# Patient Record
Sex: Male | Born: 1950
Health system: Southern US, Community
[De-identification: ages and names within clinical notes are randomized; demographics above are authoritative.]

## PROBLEM LIST (undated history)

## (undated) DIAGNOSIS — Z79899 Other long term (current) drug therapy: Secondary | ICD-10-CM

## (undated) DIAGNOSIS — R0602 Shortness of breath: Secondary | ICD-10-CM

## (undated) DIAGNOSIS — R002 Palpitations: Secondary | ICD-10-CM

## (undated) DIAGNOSIS — R7303 Prediabetes: Secondary | ICD-10-CM

## (undated) DIAGNOSIS — E119 Type 2 diabetes mellitus without complications: Secondary | ICD-10-CM

## (undated) DIAGNOSIS — R5381 Other malaise: Secondary | ICD-10-CM

## (undated) DIAGNOSIS — I10 Essential (primary) hypertension: Secondary | ICD-10-CM

## (undated) DIAGNOSIS — R911 Solitary pulmonary nodule: Secondary | ICD-10-CM

## (undated) DIAGNOSIS — F411 Generalized anxiety disorder: Secondary | ICD-10-CM

## (undated) DIAGNOSIS — R55 Syncope and collapse: Secondary | ICD-10-CM

## (undated) DIAGNOSIS — M109 Gout, unspecified: Secondary | ICD-10-CM

## (undated) DIAGNOSIS — E7800 Pure hypercholesterolemia, unspecified: Secondary | ICD-10-CM

## (undated) DIAGNOSIS — E785 Hyperlipidemia, unspecified: Secondary | ICD-10-CM

## (undated) DIAGNOSIS — E878 Other disorders of electrolyte and fluid balance, not elsewhere classified: Secondary | ICD-10-CM

## (undated) DIAGNOSIS — R072 Precordial pain: Secondary | ICD-10-CM

## (undated) DIAGNOSIS — R5383 Other fatigue: Secondary | ICD-10-CM

## (undated) DIAGNOSIS — C61 Malignant neoplasm of prostate: Secondary | ICD-10-CM

## (undated) HISTORY — DX: Prediabetes: R73.03

## (undated) HISTORY — DX: Essential (primary) hypertension: I10

## (undated) HISTORY — PX: PROSTATE BIOPSY: SHX241

## (undated) HISTORY — DX: Precordial pain: R07.2

## (undated) HISTORY — DX: Hyperlipidemia, unspecified: E78.5

## (undated) HISTORY — DX: Syncope and collapse: R55

## (undated) HISTORY — DX: Solitary pulmonary nodule: R91.1

## (undated) HISTORY — DX: Type 2 diabetes mellitus without complications: E11.9

## (undated) HISTORY — DX: Shortness of breath: R06.02

## (undated) HISTORY — DX: Gout, unspecified: M10.9

## (undated) HISTORY — DX: Pure hypercholesterolemia, unspecified: E78.00

## (undated) HISTORY — DX: Other long term (current) drug therapy: Z79.899

## (undated) HISTORY — PX: WISDOM TOOTH EXTRACTION: SHX21

## (undated) HISTORY — DX: Generalized anxiety disorder: F41.1

## (undated) HISTORY — DX: Other malaise: R53.81

## (undated) HISTORY — DX: Palpitations: R00.2

## (undated) HISTORY — DX: Other fatigue: R53.83

## (undated) HISTORY — DX: Other disorders of electrolyte and fluid balance, not elsewhere classified: E87.8

---

## 2012-02-11 ENCOUNTER — Encounter (INDEPENDENT_AMBULATORY_CARE_PROVIDER_SITE_OTHER): Payer: Self-pay | Admitting: *Deleted

## 2012-02-11 DIAGNOSIS — I498 Other specified cardiac arrhythmias: Secondary | ICD-10-CM

## 2012-02-11 DIAGNOSIS — R002 Palpitations: Secondary | ICD-10-CM

## 2012-02-11 DIAGNOSIS — R55 Syncope and collapse: Secondary | ICD-10-CM

## 2012-02-11 NOTE — Progress Notes (Unsigned)
48 hour holter requested by Dr. Olena Leatherwood at d/c from [facility] d/t near-syncope, bradycardia, and palpitations. Pt will f/u with Dr. Kirke Corin on 2/26.

## 2012-02-17 ENCOUNTER — Encounter: Payer: Self-pay | Admitting: *Deleted

## 2012-02-25 ENCOUNTER — Encounter: Payer: Self-pay | Admitting: Cardiovascular Disease

## 2012-02-25 ENCOUNTER — Ambulatory Visit (INDEPENDENT_AMBULATORY_CARE_PROVIDER_SITE_OTHER): Payer: BC Managed Care – PPO | Admitting: Cardiovascular Disease

## 2012-02-25 VITALS — BP 124/85 | HR 73 | Ht 72.0 in | Wt 209.0 lb

## 2012-02-25 DIAGNOSIS — R002 Palpitations: Secondary | ICD-10-CM | POA: Insufficient documentation

## 2012-02-25 NOTE — Patient Instructions (Signed)
Your physician recommends that you schedule a follow-up appointment as needed.   Your physician recommends that you continue on your current medications as directed. Please refer to the Current Medication list given to you today.  

## 2012-02-25 NOTE — Progress Notes (Signed)
HPI  This is a 61 year old [name] who is here today for evaluation of palpitations. He reports prolonged history of mild palpitations described as skipping in his heart without any other associated symptoms. Recently in January, he had an episode that lasted longer than usual and was associated with mild dizziness. He called EMS and was found to be stable. He subsequently followed up with Dr. Bartholomew Crews and was admitted for observation. On his baseline ECG he was noted to have slight bradycardia with a heart rate of 54 beats per minute. Telemetry showed no significant arrhythmic events. His labs were unremarkable. He was discharged home next day. He has not had any prolonged episodes of palpitations since then. He denies tachycardia. There has been no recent syncope. He is physically very active and able to perform all activities of daily living including physical yard work. He denies any exertional chest pain or dyspnea. There is no family history of premature coronary artery disease or sudden death. He is not a smoker.  No Known Allergies   No current outpatient prescriptions on file prior to visit.     Past Medical History  Diagnosis Date  . Syncope and collapse   . Unspecified essential hypertension   . Other and unspecified hyperlipidemia   . Solitary pulmonary nodule   . Encounter for long-term (current) use of other medications   . Precordial pain   . Anxiety state, unspecified   . Palpitations   . Pure hypercholesterolemia   . Other malaise and fatigue   . Shortness of breath      Past Surgical History  Procedure Date  . Wisdom tooth extraction      History reviewed. No pertinent family history.   History   Social History  . Marital Status: Married    Spouse Name: Jeffrey [name]    Number of Children: N/A  . Years of Education: N/A   Occupational History  . [employer]    Social History Main Topics  . Smoking status: Never Smoker   . Smokeless tobacco: Never  Used  . Alcohol Use: Yes     very rare  . Drug Use: No  . Sexually Active: Not on file   Other Topics Concern  . Not on file   Social History Narrative  . No narrative on file     ROS Constitutional: Negative for fever, chills, diaphoresis, activity change, appetite change and fatigue.  HENT: Negative for hearing loss, nosebleeds, congestion, sore throat, facial swelling, drooling, trouble swallowing, neck pain, voice change, sinus pressure and tinnitus.  Eyes: Negative for photophobia, pain, discharge and visual disturbance.  Respiratory: Negative for apnea, cough, chest tightness, shortness of breath and wheezing.  Cardiovascular: Negative for chest pain and leg swelling.  Gastrointestinal: Negative for nausea, vomiting, abdominal pain, diarrhea, constipation, blood in stool and abdominal distention.  Genitourinary: Negative for dysuria, urgency, frequency, hematuria and decreased urine volume.  Musculoskeletal: Negative for myalgias, back pain, joint swelling, arthralgias and gait problem.  Skin: Negative for color change, pallor, rash and wound.  Neurological: Negative for dizziness, tremors, seizures, syncope, speech difficulty, weakness, light-headedness, numbness and headaches.  Psychiatric/Behavioral: Negative for suicidal ideas, hallucinations, behavioral problems and agitation. The patient is not nervous/anxious.     PHYSICAL EXAM   BP 124/85  Pulse 73  Ht 6' (1.829 m)  Wt 209 lb (94.802 kg)  BMI 28.35 kg/m2  SpO2 97%  Constitutional: He is oriented to person, place, and time. He appears well-developed and well-nourished. No distress.  HENT: No nasal discharge.  Head: Normocephalic and atraumatic.  Eyes: Pupils are equal and round. Right eye exhibits no discharge. Left eye exhibits no discharge.  Neck: Normal range of motion. Neck supple. No JVD present. No thyromegaly present.  Cardiovascular: Normal rate, regular rhythm, normal heart sounds and. Exam reveals no  gallop and no friction rub. No murmur heard.  Pulmonary/Chest: Effort normal and breath sounds normal. No stridor. No respiratory distress. He has no wheezes. He has no rales. He exhibits no tenderness.  Abdominal: Soft. Bowel sounds are normal. He exhibits no distension. There is no tenderness. There is no rebound and no guarding.  Musculoskeletal: Normal range of motion. He exhibits no edema and no tenderness.  Neurological: He is alert and oriented to person, place, and time. Coordination normal.  Skin: Skin is warm and dry. No rash noted. He is not diaphoretic. No erythema. No pallor.  Psychiatric: He has a normal mood and affect. His behavior is normal. Judgment and thought content normal.       EKG: His recent ECG while hospitalized was reviewed. It showed normal sinus rhythm with no significant ST or T wave changes. Normal PR and QT intervals.   ASSESSMENT AND PLAN

## 2012-02-25 NOTE — Assessment & Plan Note (Signed)
The patient has isolated palpitations described as skipping in suggestive of premature beats. He had a 48-hour Holter monitor which showed normal sinus rhythm with isolated PACs and PVCs. There was no other significant arrhythmia these premature beats were asked him not frequent as he had a total of only 14 PACs in 48 hours. He has no symptoms suggestive of angina or heart failure. His physical exam is not suggestive of any cardiac murmurs. The patient was reassured. I asked him to cut down on caffeine intake. We discussed the pathophysiology of premature beats and overall benign nature. He is to followup as needed if symptoms worsen.

## 2015-05-01 ENCOUNTER — Encounter: Payer: Self-pay | Admitting: *Deleted

## 2015-05-01 ENCOUNTER — Ambulatory Visit (INDEPENDENT_AMBULATORY_CARE_PROVIDER_SITE_OTHER): Payer: 59 | Admitting: Cardiology

## 2015-05-01 ENCOUNTER — Encounter: Payer: Self-pay | Admitting: Cardiology

## 2015-05-01 VITALS — BP 148/98 | HR 59 | Ht 72.0 in | Wt 201.0 lb

## 2015-05-01 DIAGNOSIS — R002 Palpitations: Secondary | ICD-10-CM | POA: Diagnosis not present

## 2015-05-01 MED ORDER — METOPROLOL TARTRATE 25 MG PO TABS: 12.5000 mg | ORAL_TABLET | Freq: Two times a day (BID) | ORAL | Status: DC

## 2015-05-01 NOTE — Progress Notes (Signed)
Patient ID: Jeffrey [name], male   DOB: 1951-03-11, 64 y.o.   MRN: [id number]     Clinical Summary Jeffrey [name] is a 64 y.o.male seen today as a new patient for the following medical problems.   1. Palpitations - last seen by Dr Fletcher Anon 01/2012 for palpitations.  - 48 hr holter at that time showed NSR , isolated PACs and PVCs, and no arrhythmias.  - admitted to [facility] 03/2015, records reviewed. While sleeping, awoke suddenly with feeling of palpitations, diaphoretic, no chest pain, mild SOB. Lasted approximately 6-8 minutes No heavy caffeine or EtOH that night. - ER records show K 3.6, Cr 0.99, trop neg, Hgb 8, EKG NSR, and CXR no acute process - in general no coffee, occasional sodas caffeine free, occasional tea, no energy drinks, no EtOH - has had milder episodes since that time, occur daily. No SOB or DOE. Can occur at rest or with exertion.   Past Medical History  Diagnosis Date  . Syncope and collapse   . Unspecified essential hypertension   . Other and unspecified hyperlipidemia   . Solitary pulmonary nodule   . Encounter for long-term (current) use of other medications   . Precordial pain   . Anxiety state, unspecified   . Palpitations   . Pure hypercholesterolemia   . Other malaise and fatigue   . Shortness of breath      No Known Allergies   Current Outpatient Prescriptions  Medication Sig Dispense Refill  . atorvastatin (LIPITOR) 20 MG tablet Take 20 mg by mouth daily.    . Cholecalciferol (VITAMIN D3) 2000 UNITS TABS Take by mouth daily.    . fish oil-omega-3 fatty acids 1000 MG capsule Take 1 g by mouth daily.    . Garlic 937 MG CAPS Take by mouth daily.    Marland Kitchen lisinopril (PRINIVIL,ZESTRIL) 10 MG tablet Take 10 mg by mouth daily.     No current facility-administered medications for this visit.     Past Surgical History  Procedure Laterality Date  . Wisdom tooth extraction       No Known Allergies    No family history on file.   Social History Mr.  [name] reports that he has never smoked. He has never used smokeless tobacco. Jeffrey [name] reports that he drinks alcohol.   Review of Systems CONSTITUTIONAL: No weight loss, fever, chills, weakness or fatigue.  HEENT: Eyes: No visual loss, blurred vision, double vision or yellow sclerae.No hearing loss, sneezing, congestion, runny nose or sore throat.  SKIN: No rash or itching.  CARDIOVASCULAR: per HPI RESPIRATORY: No shortness of breath, cough or sputum.  GASTROINTESTINAL: No anorexia, nausea, vomiting or diarrhea. No abdominal pain or blood.  GENITOURINARY: No burning on urination, no polyuria NEUROLOGICAL: No headache, dizziness, syncope, paralysis, ataxia, numbness or tingling in the extremities. No change in bowel or bladder control.  MUSCULOSKELETAL: No muscle, back pain, joint pain or stiffness.  LYMPHATICS: No enlarged nodes. No history of splenectomy.  PSYCHIATRIC: No history of depression or anxiety.  ENDOCRINOLOGIC: No reports of sweating, cold or heat intolerance. No polyuria or polydipsia.  Marland Kitchen   Physical Examination p 59 bp 148/98 Wt 201 lbs BMI 27 Gen: resting comfortably, no acute distress HEENT: no scleral icterus, pupils equal round and reactive, no palptable cervical adenopathy,  CV: RRR, no m/r/g, no JVD, no carotid bruits Resp: Clear to auscultation bilaterally GI: abdomen is soft, non-tender, non-distended, normal bowel sounds, no hepatosplenomegaly MSK: extremities are warm, no edema.  Skin: warm, no rash  Neuro:  no focal deficits Psych: appropriate affect     Assessment and Plan  1. Palpitations - several year history with fairly normal Holter monitor in 2013 - recent episode requiring ER visit, EKG at that time showed NSR.  - will start lopressor 12.5mg  bid - will request most recent labs from Dr Amanda Pea to see if TSH and Mg included, if not will need to order next visit  2. Anemia - Hgb of 8 during ER visit, anemia workup per pcp  F/u 6  weeks      Arnoldo Lenis, M.D

## 2015-05-01 NOTE — Patient Instructions (Signed)
Your physician recommends that you schedule a follow-up appointment in: [place] DR. [employer]  Your physician has recommended you make the following change in your medication:   START METOPROLOL 12.5 MG TWICE DAILY  CONTINUE ALL OTHER MEDICATIONS AS DIRECTED  WE WILL REQUEST LABS FROM YOUR PCP AND RECORDS FROM [facility]   Thank you for choosing [facility]!!

## 2015-05-09 ENCOUNTER — Emergency Department (HOSPITAL_COMMUNITY): Payer: 59

## 2015-05-09 ENCOUNTER — Encounter (HOSPITAL_COMMUNITY): Payer: Self-pay

## 2015-05-09 ENCOUNTER — Emergency Department (HOSPITAL_COMMUNITY)
Admission: EM | Admit: 2015-05-09 | Discharge: 2015-05-09 | Disposition: A | Discharge status: Home / Self Care | Payer: 59 | Attending: Emergency Medicine | Admitting: Emergency Medicine

## 2015-05-09 DIAGNOSIS — R002 Palpitations: Secondary | ICD-10-CM | POA: Diagnosis present

## 2015-05-09 DIAGNOSIS — Z79899 Other long term (current) drug therapy: Secondary | ICD-10-CM | POA: Diagnosis not present

## 2015-05-09 DIAGNOSIS — E785 Hyperlipidemia, unspecified: Secondary | ICD-10-CM | POA: Diagnosis not present

## 2015-05-09 DIAGNOSIS — R52 Pain, unspecified: Secondary | ICD-10-CM

## 2015-05-09 DIAGNOSIS — Z8659 Personal history of other mental and behavioral disorders: Secondary | ICD-10-CM | POA: Insufficient documentation

## 2015-05-09 DIAGNOSIS — E780 Pure hypercholesterolemia: Secondary | ICD-10-CM | POA: Insufficient documentation

## 2015-05-09 DIAGNOSIS — I10 Essential (primary) hypertension: Secondary | ICD-10-CM | POA: Diagnosis not present

## 2015-05-09 LAB — I-STAT TROPONIN, ED: Troponin i, poc: 0 ng/mL (ref 0.00–0.08)

## 2015-05-09 LAB — I-STAT CHEM 8, ED
BUN: 8 mg/dL (ref 6–20)
CALCIUM ION: 1.2 mmol/L (ref 1.13–1.30)
CHLORIDE: 105 mmol/L (ref 101–111)
Creatinine, Ser: 0.9 mg/dL (ref 0.61–1.24)
GLUCOSE: 94 mg/dL (ref 70–99)
HEMATOCRIT: 43 % (ref 39.0–52.0)
HEMOGLOBIN: 14.6 g/dL (ref 13.0–17.0)
POTASSIUM: 4.4 mmol/L (ref 3.5–5.1)
Sodium: 140 mmol/L (ref 135–145)
TCO2: 22 mmol/L (ref 0–100)

## 2015-05-09 NOTE — ED Notes (Signed)
PT placed in gown and in bed. Pt monitored by pulse ox, bp cuff, and 12-lead. 

## 2015-05-09 NOTE — ED Notes (Signed)
Lab results of I-Stat Tron.was 0.00 reported to Dr.Zammit and Chem 8.

## 2015-05-09 NOTE — ED Notes (Signed)
Pt.   Was diagnosed with Palpitations on May 5th, 2016.a  Pt. Was placed on Metoprolol last Tuesday.  Pt was at work today and felt the palpations.  Pt. Denies any chest pain or sob .  Pt. Denies any n/v/d.  Pt. Reports having intermittent palpitations last week.   He felt today was worse.  Skin is warm and dry.  NSR on the monitor with an occasional PAC.  GCS.  Pt.s sees Dr,. Branch.

## 2015-05-09 NOTE — Discharge Instructions (Signed)
Take one extra dose of your lopressor daily  If you continue to have lots of palpitations.  Follow up with your md next week.

## 2015-05-10 ENCOUNTER — Telehealth (HOSPITAL_BASED_OUTPATIENT_CLINIC_OR_DEPARTMENT_OTHER): Payer: Self-pay | Admitting: Emergency Medicine

## 2015-05-10 NOTE — ED Provider Notes (Signed)
CSN: [id number]     Arrival date & time 05/09/15  1306 History   First MD Initiated Contact with Patient 05/09/15 1309     Chief Complaint  Patient presents with  . Irregular Heart Beat     (Consider location/radiation/quality/duration/timing/severity/associated sxs/prior Treatment) Patient is a 64 y.o. male presenting with palpitations. The history is provided by the patient (pt complains of palpitations).  Palpitations Palpitations quality:  Regular Onset quality:  At rest Timing:  Intermittent Progression:  Waxing and waning Chronicity:  Recurrent Context: not anxiety   Associated symptoms: no back pain, no chest pain and no cough     Past Medical History  Diagnosis Date  . Syncope and collapse   . Unspecified essential hypertension   . Other and unspecified hyperlipidemia   . Solitary pulmonary nodule   . Encounter for long-term (current) use of other medications   . Precordial pain   . Anxiety state, unspecified   . Palpitations   . Pure hypercholesterolemia   . Other malaise and fatigue   . Shortness of breath    Past Surgical History  Procedure Laterality Date  . Wisdom tooth extraction     Family History  Problem Relation Age of Onset  . Heart disease Mother    History  Substance Use Topics  . Smoking status: Never Smoker   . Smokeless tobacco: Never Used  . Alcohol Use: Yes     Comment: very rare    Review of Systems  Constitutional: Negative for appetite change and fatigue.  HENT: Negative for congestion, ear discharge and sinus pressure.   Eyes: Negative for discharge.  Respiratory: Negative for cough.   Cardiovascular: Positive for palpitations. Negative for chest pain.  Gastrointestinal: Negative for abdominal pain and diarrhea.  Genitourinary: Negative for frequency and hematuria.  Musculoskeletal: Negative for back pain.  Skin: Negative for rash.  Neurological: Negative for seizures and headaches.  Psychiatric/Behavioral: Negative for  hallucinations.      Allergies  Review of patient's allergies indicates no known allergies.  Home Medications   Prior to Admission medications   Medication Sig Start Date End Date Taking? Authorizing Provider  atorvastatin (LIPITOR) 20 MG tablet Take 20 mg by mouth daily.   Yes Historical Provider, MD  Cholecalciferol (VITAMIN D3) 2000 UNITS TABS Take 2,000 Units by mouth daily.    Yes Historical Provider, MD  ferrous sulfate 325 (65 FE) MG tablet Take 325 mg by mouth 2 (two) times daily. 04/14/15  Yes Historical Provider, MD  fish oil-omega-3 fatty acids 1000 MG capsule Take 1 g by mouth daily.   Yes Historical Provider, MD  lisinopril (PRINIVIL,ZESTRIL) 10 MG tablet Take 10 mg by mouth daily.   Yes Historical Provider, MD  metoprolol tartrate (LOPRESSOR) 25 MG tablet Take 0.5 tablets (12.5 mg total) by mouth 2 (two) times daily. 05/01/15  Yes Arnoldo Lenis, MD   BP 146/79 mmHg  Pulse 55  Temp(Src) 98 F (36.7 C) (Oral)  Resp 20  SpO2 100% Physical Exam  Constitutional: He is oriented to person, place, and time. He appears well-developed.  HENT:  Head: Normocephalic.  Eyes: Conjunctivae and EOM are normal. No scleral icterus.  Neck: Neck supple. No thyromegaly present.  Cardiovascular: Normal rate and regular rhythm.  Exam reveals no gallop and no friction rub.   No murmur heard. Pulmonary/Chest: No stridor. He has no wheezes. He has no rales. He exhibits no tenderness.  Abdominal: He exhibits no distension. There is no tenderness. There is no rebound.  Musculoskeletal: Normal range of motion. He exhibits no edema.  Lymphadenopathy:    He has no cervical adenopathy.  Neurological: He is oriented to person, place, and time. He exhibits normal muscle tone. Coordination normal.  Skin: No rash noted. No erythema.  Psychiatric: He has a normal mood and affect. His behavior is normal.    ED Course  Procedures (including critical care time) Labs Review Labs Reviewed  I-STAT  CHEM 8, ED  I-STAT TROPOININ, ED    Imaging Review Dg Chest Port 1 View  05/09/2015   CLINICAL DATA:  Woke up by 1230 hours with heart racing and diaphoresis, history palpitations, hypertension  EXAM: PORTABLE CHEST - 1 VIEW  COMPARISON:  Portable exam 1337 hours compared to 04/14/2015  FINDINGS: Enlargement of cardiac silhouette.  Mediastinal contours and pulmonary vascularity normal.  Minimal bibasilar atelectasis.  Lungs otherwise clear.  No pleural effusion or pneumothorax.  Minimal endplate spur formation thoracic spine.  IMPRESSION: Enlargement of cardiac silhouette with minimal bibasilar atelectasis.   Electronically Signed   By: Lavonia Dana M.D.   On: 05/09/2015 13:54     EKG Interpretation   Date/Time:  Tuesday May 09 2015 13:13:31 EDT Ventricular Rate:  67 PR Interval:  154 QRS Duration: 86 QT Interval:  385 QTC Calculation: 406 R Axis:   31 Text Interpretation:  Sinus rhythm Atrial premature complex Confirmed by  Darolyn Double  MD, Chancie Lampert ([id number]) on 05/09/2015 1:59:51 PM      MDM   Final diagnoses:  Pain  Palpitations    Mild palpitations.  Pt told to take and extra dose of lopressor if needed and follow up with his md    Milton Ferguson, MD 05/10/15 [phone]

## 2015-05-18 ENCOUNTER — Telehealth: Payer: Self-pay | Admitting: Cardiology

## 2015-05-18 MED ORDER — DILTIAZEM HCL 30 MG PO TABS: 30.0000 mg | ORAL_TABLET | Freq: Two times a day (BID) | ORAL | Status: DC

## 2015-05-18 NOTE — Telephone Encounter (Signed)
Pt made aware will d/c metoprolol and start diltiazem 30 mg BID. Updated medication list and sent medication to pharmacy.

## 2015-05-18 NOTE — Telephone Encounter (Signed)
Pt says since starting Lopressor he has some fatigue, "like having a hangover", doesn't happen everyday, wants to know if anything he can do to relieve symptoms. Denies CP/dizziness. Told pt to take with food and he stated that when he took this morning with breakfast that he has not felt as bad today. Will forward to Dr. Harl Bowie for further suggestions.

## 2015-05-18 NOTE — Telephone Encounter (Signed)
Lopressor can sometimes have that affect. I would suggest trying him on another medication to help with his palpitations that is less likely to cause fatigue. Recommend stopping lopressor for now, starting short acting diltiazem 30mg  bid   Zandra Abts MD

## 2015-05-18 NOTE — Telephone Encounter (Signed)
Jeffrey [name] calls stating that since taking the (LOPRESSOR) 25 MG tablet he feels like the medicine is causing him to have a "hangover" feeling, fatigue, little nervous feeling  Please advise

## 2015-06-01 ENCOUNTER — Encounter: Payer: Self-pay | Admitting: Cardiology

## 2015-06-01 ENCOUNTER — Ambulatory Visit (INDEPENDENT_AMBULATORY_CARE_PROVIDER_SITE_OTHER): Payer: 59 | Admitting: Cardiology

## 2015-06-01 VITALS — BP 137/83 | HR 65 | Ht 72.0 in | Wt 196.0 lb

## 2015-06-01 DIAGNOSIS — R002 Palpitations: Secondary | ICD-10-CM

## 2015-06-01 NOTE — Patient Instructions (Signed)
Your physician wants you to follow-up in: 6 months with Dr. Bryna Colander will receive a reminder letter in the mail two months in advance. If you don't receive a letter, please call our office to schedule the follow-up appointment.  Your physician recommends that you continue on your current medications as directed. Please refer to the Current Medication list given to you today.  Your physician recommends that you return for lab work TSH/MG  Thank you for choosing [facility]!!

## 2015-06-01 NOTE — Progress Notes (Signed)
Clinical Summary Mr. Furio is a 64 y.o.male seen today for follow up of the following medical problems.   1. Palpitations - last seen by Dr Fletcher Anon 01/2012 for palpitations.  - 48 hr holter at that time showed NSR , isolated PACs and PVCs, and no arrhythmias.  - admitted to [facility] 03/2015, records reviewed. While sleeping, awoke suddenly with feeling of palpitations, diaphoretic, no chest pain, mild SOB. Lasted approximately 6-8 minutes No heavy caffeine or EtOH that night. - ER records show K 3.6, Cr 0.99, trop neg, Hgb 8, EKG NSR, and CXR no acute process - in general no coffee, occasional sodas caffeine free, occasional tea, no energy drinks, no EtOH - has had milder episodes since that time, occur daily. No SOB or DOE. Can occur at rest or with exertion.  - 03/2015 labs: Hgb 10, K 3.6,   - Since last visit seen in ER 05/10/15 with palpitations. EKG showed SR with isolated PAC.  - we started him on lopressor but this caused fatigue, changed to dilt and fatigue resolved, palpitations controlled.    Past Medical History  Diagnosis Date  . Syncope and collapse   . Unspecified essential hypertension   . Other and unspecified hyperlipidemia   . Solitary pulmonary nodule   . Encounter for long-term (current) use of other medications   . Precordial pain   . Anxiety state, unspecified   . Palpitations   . Pure hypercholesterolemia   . Other malaise and fatigue   . Shortness of breath      No Known Allergies   Current Outpatient Prescriptions  Medication Sig Dispense Refill  . atorvastatin (LIPITOR) 20 MG tablet Take 20 mg by mouth daily.    . Cholecalciferol (VITAMIN D3) 2000 UNITS TABS Take 2,000 Units by mouth daily.     Marland Kitchen diltiazem (CARDIZEM) 30 MG tablet Take 1 tablet (30 mg total) by mouth 2 (two) times daily. 60 tablet 3  . ferrous sulfate 325 (65 FE) MG tablet Take 325 mg by mouth 2 (two) times daily.  0  . fish oil-omega-3 fatty acids 1000 MG capsule Take 1 g by  mouth daily.    Marland Kitchen lisinopril (PRINIVIL,ZESTRIL) 10 MG tablet Take 10 mg by mouth daily.     No current facility-administered medications for this visit.     Past Surgical History  Procedure Laterality Date  . Wisdom tooth extraction       No Known Allergies    Family History  Problem Relation Age of Onset  . Heart disease Mother      Social History Mr. Levario reports that he has never smoked. He has never used smokeless tobacco. Mr. Angelillo reports that he drinks alcohol.   Review of Systems CONSTITUTIONAL: No weight loss, fever, chills, weakness or fatigue.  HEENT: Eyes: No visual loss, blurred vision, double vision or yellow sclerae.No hearing loss, sneezing, congestion, runny nose or sore throat.  SKIN: No rash or itching.  CARDIOVASCULAR: per HPI RESPIRATORY: No shortness of breath, cough or sputum.  GASTROINTESTINAL: No anorexia, nausea, vomiting or diarrhea. No abdominal pain or blood.  GENITOURINARY: No burning on urination, no polyuria NEUROLOGICAL: No headache, dizziness, syncope, paralysis, ataxia, numbness or tingling in the extremities. No change in bowel or bladder control.  MUSCULOSKELETAL: No muscle, back pain, joint pain or stiffness.  LYMPHATICS: No enlarged nodes. No history of splenectomy.  PSYCHIATRIC: No history of depression or anxiety.  ENDOCRINOLOGIC: No reports of sweating, cold or heat intolerance. No polyuria  or polydipsia.  Marland Kitchen   Physical Examination Filed Vitals:   06/01/15 1506  BP: 137/83  Pulse: 65   Filed Vitals:   06/01/15 1506  Height: 6' (1.829 m)  Weight: 196 lb (88.905 kg)    Gen: resting comfortably, no acute distress HEENT: no scleral icterus, pupils equal round and reactive, no palptable cervical adenopathy,  CV: RRR, no m/r/g, no jvd Resp: Clear to auscultation bilaterally GI: abdomen is soft, non-tender, non-distended, normal bowel sounds, no hepatosplenomegaly MSK: extremities are warm, no edema.  Skin: warm, no  rash Neuro:  no focal deficits Psych: appropriate affect    Assessment and Plan  1. Palpitations - several year history with fairly normal Holter monitor in 2013 - recent episodes requiring ER visits, EKG has shown only NSR.  - well controlled currently on dilt, will continue - check TSH, Mg   2. Anemia - Hgb of 8 during ER visit, anemia workup per pcp    F/u 6 months  Arnoldo Lenis, M.D.

## 2015-06-29 ENCOUNTER — Encounter: Payer: Self-pay | Admitting: *Deleted

## 2015-07-04 ENCOUNTER — Telehealth: Payer: Self-pay | Admitting: *Deleted

## 2015-07-04 NOTE — Telephone Encounter (Signed)
Can add on Thursday  J Aime Meloche MD

## 2015-07-04 NOTE — Telephone Encounter (Signed)
Pt would like to be seen to discuss worsening palpatations, with some days taking 3 tabs of diltiazem with no changes. Denies chest pain/SOB/dizziness. Will forward to Dr. Harl Bowie if ok to add Thursday @ 2pm

## 2015-07-06 ENCOUNTER — Ambulatory Visit (INDEPENDENT_AMBULATORY_CARE_PROVIDER_SITE_OTHER): Payer: 59 | Admitting: Cardiology

## 2015-07-06 ENCOUNTER — Encounter: Payer: Self-pay | Admitting: Cardiology

## 2015-07-06 VITALS — BP 134/86 | HR 57 | Ht 72.0 in | Wt 192.0 lb

## 2015-07-06 DIAGNOSIS — R002 Palpitations: Secondary | ICD-10-CM

## 2015-07-06 MED ORDER — DILTIAZEM HCL 60 MG PO TABS: 60.0000 mg | ORAL_TABLET | Freq: Two times a day (BID) | ORAL | Status: DC

## 2015-07-06 NOTE — Patient Instructions (Signed)
Your physician has recommended you make the following change in your medication:  Increase your diltiazem to 60 mg twice daily. You may take (2) of your 30 mg twice daily until they are finished. Continue all other medications the same. Your physician recommends that you schedule a follow-up appointment in: 6 weeks.

## 2015-07-06 NOTE — Progress Notes (Signed)
Patient ID: Jeffrey [name], male   DOB: 01/17/1951, 64 y.o.   MRN: [id number]     Clinical Summary Jeffrey [name] is a 64 y.o.male seen today for follow up of the following medical problems.   1. Palpitations - previous 48 hr holter at that time showed NSR , isolated PACs and PVCs, and no arrhythmias.   - we started him on lopressor but this caused fatigue, changed to dilt and fatigue resolved which initially improved symptoms -notes  increased in frequency in palpitations since Thursday. Compliant with diltiazem, has taken extra as needed.  - avoiding caffeine and EtoH Past Medical History  Diagnosis Date  . Syncope and collapse   . Unspecified essential hypertension   . Other and unspecified hyperlipidemia   . Solitary pulmonary nodule   . Encounter for long-term (current) use of other medications   . Precordial pain   . Anxiety state, unspecified   . Palpitations   . Pure hypercholesterolemia   . Other malaise and fatigue   . Shortness of breath      No Known Allergies   Current Outpatient Prescriptions  Medication Sig Dispense Refill  . atorvastatin (LIPITOR) 20 MG tablet Take 20 mg by mouth daily.    . Cholecalciferol (VITAMIN D3) 2000 UNITS TABS Take 2,000 Units by mouth daily.     Marland Kitchen diltiazem (CARDIZEM) 30 MG tablet Take 1 tablet (30 mg total) by mouth 2 (two) times daily. 60 tablet 3  . ferrous sulfate 325 (65 FE) MG tablet Take 325 mg by mouth 2 (two) times daily.  0  . fish oil-omega-3 fatty acids 1000 MG capsule Take 1 g by mouth daily.    Marland Kitchen lisinopril (PRINIVIL,ZESTRIL) 10 MG tablet Take 10 mg by mouth daily.     No current facility-administered medications for this visit.     Past Surgical History  Procedure Laterality Date  . Wisdom tooth extraction       No Known Allergies    Family History  Problem Relation Age of Onset  . Heart disease Mother      Social History Jeffrey [name] reports that he has never smoked. He has never used smokeless  tobacco. Jeffrey [name] reports that he drinks alcohol.   Review of Systems CONSTITUTIONAL: No weight loss, fever, chills, weakness or fatigue.  HEENT: Eyes: No visual loss, blurred vision, double vision or yellow sclerae.No hearing loss, sneezing, congestion, runny nose or sore throat.  SKIN: No rash or itching.  CARDIOVASCULAR: per HPI RESPIRATORY: No shortness of breath, cough or sputum.  GASTROINTESTINAL: No anorexia, nausea, vomiting or diarrhea. No abdominal pain or blood.  GENITOURINARY: No burning on urination, no polyuria NEUROLOGICAL: No headache, dizziness, syncope, paralysis, ataxia, numbness or tingling in the extremities. No change in bowel or bladder control.  MUSCULOSKELETAL: No muscle, back pain, joint pain or stiffness.  LYMPHATICS: No enlarged nodes. No history of splenectomy.  PSYCHIATRIC: No history of depression or anxiety.  ENDOCRINOLOGIC: No reports of sweating, cold or heat intolerance. No polyuria or polydipsia.  Marland Kitchen   Physical Examination Filed Vitals:   07/06/15 1344  BP: 134/86  Pulse: 57   Filed Vitals:   07/06/15 1344  Height: 6' (1.829 m)  Weight: 192 lb (87.091 kg)    Gen: resting comfortably, no acute distress HEENT: no scleral icterus, pupils equal round and reactive, no palptable cervical adenopathy,  CV: RRR, no m/r/g, no JVD Resp: Clear to auscultation bilaterally GI: abdomen is soft, non-tender, non-distended, normal bowel sounds, no hepatosplenomegaly MSK:  extremities are warm, no edema.  Skin: warm, no rash Neuro:  no focal deficits Psych: appropriate affect     Assessment and Plan  1. Palpitations - several year history with fairly normal Holter monitor in 2013 - notes recent increase in symptoms - will increase dilt to 60mg  bid. If symptoms persist will consider repeat cardiac monitor for extended period of time.     F/u 6 weeks   Arnoldo Lenis, M.D

## 2015-08-21 ENCOUNTER — Encounter: Payer: Self-pay | Admitting: Cardiology

## 2015-08-21 ENCOUNTER — Ambulatory Visit (INDEPENDENT_AMBULATORY_CARE_PROVIDER_SITE_OTHER): Payer: Medicare HMO | Admitting: Cardiology

## 2015-08-21 VITALS — BP 139/89 | HR 54 | Ht 72.0 in | Wt 191.1 lb

## 2015-08-21 DIAGNOSIS — R002 Palpitations: Secondary | ICD-10-CM | POA: Diagnosis not present

## 2015-08-21 NOTE — Progress Notes (Signed)
Patient ID: Jeffrey [name], male   DOB: 05-09-1951, 64 y.o.   MRN: [id number]     Clinical Summary Jeffrey [name] is a 64 y.o.male seen today for follow up of the following medical problems.   1. Palpitations - previous 48 hr holter at that time showed NSR , isolated PACs and PVCs, and no arrhythmias.  - we started him on lopressor but this caused fatigue, changed to dilt and fatigue resolved which initially improved symptoms - avoiding caffeine and EtoH - last visit increased dilt to 60mg  bid due to progressing symptoms, since that time palpitations have improved  2. 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   Past Medical History  Diagnosis Date  . Syncope and collapse   . Unspecified essential hypertension   . Other and unspecified hyperlipidemia   . Solitary pulmonary nodule   . Encounter for long-term (current) use of other medications   . Precordial pain   . Anxiety state, unspecified   . Palpitations   . Pure hypercholesterolemia   . Other malaise and fatigue   . Shortness of breath      No Known Allergies   Current Outpatient Prescriptions  Medication Sig Dispense Refill  . atorvastatin (LIPITOR) 20 MG tablet Take 20 mg by mouth daily.    . Cholecalciferol (VITAMIN D3) 2000 UNITS TABS Take 2,000 Units by mouth daily.     Marland Kitchen diltiazem (CARDIZEM) 60 MG tablet Take 1 tablet (60 mg total) by mouth 2 (two) times daily. 60 tablet 3  . ferrous sulfate 325 (65 FE) MG tablet Take 325 mg by mouth 2 (two) times daily.  0  . fish oil-omega-3 fatty acids 1000 MG capsule Take 1 g by mouth daily.    Marland Kitchen lisinopril (PRINIVIL,ZESTRIL) 10 MG tablet Take 10 mg by mouth daily.     No current facility-administered medications for this visit.     Past Surgical History  Procedure Laterality Date  . Wisdom tooth extraction       No Known Allergies    Family History  Problem Relation Age  of Onset  . Heart disease Mother      Social History Jeffrey [name] reports that he has never smoked. He has never used smokeless tobacco. Jeffrey [name] reports that he drinks alcohol.   Review of Systems CONSTITUTIONAL: No weight loss, fever, chills, weakness or fatigue.  HEENT: Eyes: No visual loss, blurred vision, double vision or yellow sclerae.No hearing loss, sneezing, congestion, runny nose or sore throat.  SKIN: No rash or itching.  CARDIOVASCULAR: per HPI RESPIRATORY: No shortness of breath, cough or sputum.  GASTROINTESTINAL: No anorexia, nausea, vomiting or diarrhea. No abdominal pain or blood.  GENITOURINARY: No burning on urination, no polyuria NEUROLOGICAL: No headache, dizziness, syncope, paralysis, ataxia, numbness or tingling in the extremities. No change in bowel or bladder control.  MUSCULOSKELETAL: No muscle, back pain, joint pain or stiffness.  LYMPHATICS: No enlarged nodes. No history of splenectomy.  PSYCHIATRIC: No history of depression or anxiety.  ENDOCRINOLOGIC: No reports of sweating, cold or heat intolerance. No polyuria or polydipsia.  Marland Kitchen   Physical Examination Filed Vitals:   08/21/15 0818  BP: 139/89  Pulse: 54   Filed Vitals:   08/21/15 0818  Height: 6' (1.829 m)  Weight: 191 lb 1.9 oz (86.691 kg)    Gen: resting comfortably, no acute distress HEENT: no scleral icterus, pupils equal round and reactive, no  palptable cervical adenopathy,  CV: RRR, no m/r/g, nO JVD Resp: Clear to auscultation bilaterally GI: abdomen is soft, non-tender, non-distended, normal bowel sounds, no hepatosplenomegaly MSK: extremities are warm, no edema.  Skin: warm, no rash Neuro:  no focal deficits Psych: appropriate affect   Diagnostic Studies 48 hr holter at that time showed NSR , isolated PACs and PVCs, and no arrhythmias    Assessment and Plan   1. Palpitations - several year history of symptoms. Fairly normal Holter monitor in 2013 - doing well since we  increased his dilt to 60mg  bid, will continue current meds   F/u 6 months     Arnoldo Lenis, M.D.

## 2015-08-21 NOTE — Patient Instructions (Signed)

## 2015-09-03 ENCOUNTER — Other Ambulatory Visit: Payer: Self-pay | Admitting: Cardiology

## 2015-10-13 ENCOUNTER — Other Ambulatory Visit: Payer: Self-pay | Admitting: *Deleted

## 2015-10-13 MED ORDER — DILTIAZEM HCL 60 MG PO TABS: 60.0000 mg | ORAL_TABLET | Freq: Two times a day (BID) | ORAL | Status: DC

## 2016-01-31 ENCOUNTER — Encounter: Payer: Self-pay | Admitting: *Deleted

## 2016-01-31 ENCOUNTER — Ambulatory Visit (INDEPENDENT_AMBULATORY_CARE_PROVIDER_SITE_OTHER): Payer: PPO | Admitting: Cardiology

## 2016-01-31 ENCOUNTER — Encounter: Payer: Self-pay | Admitting: Cardiology

## 2016-01-31 VITALS — BP 126/83 | HR 62 | Ht 72.0 in | Wt 190.0 lb

## 2016-01-31 DIAGNOSIS — R002 Palpitations: Secondary | ICD-10-CM | POA: Diagnosis not present

## 2016-01-31 DIAGNOSIS — E785 Hyperlipidemia, unspecified: Secondary | ICD-10-CM

## 2016-01-31 DIAGNOSIS — I10 Essential (primary) hypertension: Secondary | ICD-10-CM | POA: Diagnosis not present

## 2016-01-31 NOTE — Progress Notes (Signed)
Patient ID: Jeffrey [name], male   DOB: 11/24/1951, 65 y.o.   MRN: [id number]     Clinical Summary Jeffrey [name] is a 65 y.o.male seen today for follow up of the following medical problems.   1. Palpitations - previous 48 hr holter  showed NSR , isolated PACs and PVCs, and no arrhythmias.  - we started him on lopressor but this caused fatigue, changed to dilt and fatigue resolved - avoiding caffeine and EtoH - denies any recent palpitations. Compliant with medications.   2. HTN - does not check at home - compliant with meds  3. 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 Spending lots of time with grandchildren age 65 and 31.  Past Medical History  Diagnosis Date  . Syncope and collapse   . Unspecified essential hypertension   . Other and unspecified hyperlipidemia   . Solitary pulmonary nodule   . Encounter for long-term (current) use of other medications   . Precordial pain   . Anxiety state, unspecified   . Palpitations   . Pure hypercholesterolemia   . Other malaise and fatigue   . Shortness of breath      No Known Allergies   Current Outpatient Prescriptions  Medication Sig Dispense Refill  . atorvastatin (LIPITOR) 20 MG tablet Take 20 mg by mouth daily.    . Cholecalciferol (VITAMIN D3) 2000 UNITS TABS Take 2,000 Units by mouth daily.     Marland Kitchen diltiazem (CARDIZEM) 30 MG tablet TAKE 1 TABLET (30 MG TOTAL) BY MOUTH 2 (TWO) TIMES DAILY. 60 tablet 3  . diltiazem (CARDIZEM) 60 MG tablet Take 1 tablet (60 mg total) by mouth 2 (two) times daily. 180 tablet 3  . ferrous sulfate 325 (65 FE) MG tablet Take 325 mg by mouth 2 (two) times daily.  0  . fish oil-omega-3 fatty acids 1000 MG capsule Take 1 g by mouth daily.    Marland Kitchen lisinopril (PRINIVIL,ZESTRIL) 10 MG tablet Take 10 mg by mouth daily.     No current  facility-administered medications for this visit.     Past Surgical History  Procedure Laterality Date  . Wisdom tooth extraction       No Known Allergies    Family History  Problem Relation Age of Onset  . Heart disease Mother      Social History Jeffrey [name] reports that he has never smoked. He has never used smokeless tobacco. Jeffrey [name] reports that he drinks alcohol.   Review of Systems CONSTITUTIONAL: No weight loss, fever, chills, weakness or fatigue.  HEENT: Eyes: No visual loss, blurred vision, double vision or yellow sclerae.No hearing loss, sneezing, congestion, runny nose or sore throat.  SKIN: No rash or itching.  CARDIOVASCULAR: per HPI RESPIRATORY: No shortness of breath, cough or sputum.  GASTROINTESTINAL: No anorexia, nausea, vomiting or diarrhea. No abdominal pain or blood.  GENITOURINARY: No burning on urination, no polyuria NEUROLOGICAL: No headache, dizziness, syncope, paralysis, ataxia, numbness or tingling in the extremities. No change in bowel or bladder control.  MUSCULOSKELETAL: No muscle, back pain, joint pain or stiffness.  LYMPHATICS: No enlarged nodes. No history of splenectomy.  PSYCHIATRIC: No history of depression or anxiety.  ENDOCRINOLOGIC: No reports of sweating, cold or heat intolerance. No polyuria or polydipsia.  Marland Kitchen   Physical Examination Filed Vitals:   01/31/16 0818  BP: 126/83  Pulse: 62   Filed Vitals:   01/31/16 0818  Height: 6' (1.829 m)  Weight: 190 lb (86.183 kg)    Gen: resting comfortably, no acute distress HEENT: no scleral icterus, pupils equal round and reactive, no palptable cervical adenopathy,  CV: RRR, no m/r/g, no jvd Resp: Clear to auscultation bilaterally GI: abdomen is soft, non-tender, non-distended, normal bowel sounds, no hepatosplenomegaly MSK: extremities are warm, no edema.  Skin: warm, no rash Neuro:  no focal deficits Psych: appropriate affect   Diagnostic Studies 48 hr holter at that  time showed NSR , isolated PACs and PVCs, and no arrhythmias     Assessment and Plan  1. Palpitations - no current symptoms, has done well on diltiazem. Continue current meds  2. HTN - at goal, continue current meds  3. Hyperlipidemia - request lipid panel from pcp, continue current statin.    F/u 1 year       Arnoldo Lenis, M.D.

## 2016-01-31 NOTE — Patient Instructions (Signed)

## 2016-02-27 DIAGNOSIS — I10 Essential (primary) hypertension: Secondary | ICD-10-CM | POA: Diagnosis not present

## 2016-02-27 DIAGNOSIS — E119 Type 2 diabetes mellitus without complications: Secondary | ICD-10-CM | POA: Diagnosis not present

## 2016-02-27 DIAGNOSIS — Z6825 Body mass index (BMI) 25.0-25.9, adult: Secondary | ICD-10-CM | POA: Diagnosis not present

## 2016-04-24 DIAGNOSIS — R002 Palpitations: Secondary | ICD-10-CM | POA: Diagnosis not present

## 2016-04-24 DIAGNOSIS — E7800 Pure hypercholesterolemia, unspecified: Secondary | ICD-10-CM | POA: Diagnosis not present

## 2016-04-24 DIAGNOSIS — Z8249 Family history of ischemic heart disease and other diseases of the circulatory system: Secondary | ICD-10-CM | POA: Diagnosis not present

## 2016-04-24 DIAGNOSIS — R42 Dizziness and giddiness: Secondary | ICD-10-CM | POA: Diagnosis not present

## 2016-04-24 DIAGNOSIS — Z7982 Long term (current) use of aspirin: Secondary | ICD-10-CM | POA: Diagnosis not present

## 2016-04-24 DIAGNOSIS — I10 Essential (primary) hypertension: Secondary | ICD-10-CM | POA: Diagnosis not present

## 2016-04-24 DIAGNOSIS — Z79899 Other long term (current) drug therapy: Secondary | ICD-10-CM | POA: Diagnosis not present

## 2016-06-03 DIAGNOSIS — I10 Essential (primary) hypertension: Secondary | ICD-10-CM | POA: Diagnosis not present

## 2016-06-03 DIAGNOSIS — Z6826 Body mass index (BMI) 26.0-26.9, adult: Secondary | ICD-10-CM | POA: Diagnosis not present

## 2016-06-03 DIAGNOSIS — I700 Atherosclerosis of aorta: Secondary | ICD-10-CM | POA: Diagnosis not present

## 2016-06-03 DIAGNOSIS — Z125 Encounter for screening for malignant neoplasm of prostate: Secondary | ICD-10-CM | POA: Diagnosis not present

## 2016-06-03 DIAGNOSIS — E119 Type 2 diabetes mellitus without complications: Secondary | ICD-10-CM | POA: Diagnosis not present

## 2016-09-09 DIAGNOSIS — I10 Essential (primary) hypertension: Secondary | ICD-10-CM | POA: Diagnosis not present

## 2016-09-09 DIAGNOSIS — I700 Atherosclerosis of aorta: Secondary | ICD-10-CM | POA: Diagnosis not present

## 2016-09-09 DIAGNOSIS — Z0000 Encounter for general adult medical examination without abnormal findings: Secondary | ICD-10-CM | POA: Diagnosis not present

## 2016-09-09 DIAGNOSIS — E119 Type 2 diabetes mellitus without complications: Secondary | ICD-10-CM | POA: Diagnosis not present

## 2016-09-09 DIAGNOSIS — Z6826 Body mass index (BMI) 26.0-26.9, adult: Secondary | ICD-10-CM | POA: Diagnosis not present

## 2016-09-09 DIAGNOSIS — Z1389 Encounter for screening for other disorder: Secondary | ICD-10-CM | POA: Diagnosis not present

## 2016-10-18 DIAGNOSIS — H524 Presbyopia: Secondary | ICD-10-CM | POA: Diagnosis not present

## 2016-10-18 DIAGNOSIS — H35032 Hypertensive retinopathy, left eye: Secondary | ICD-10-CM | POA: Diagnosis not present

## 2016-10-25 ENCOUNTER — Other Ambulatory Visit: Payer: Self-pay | Admitting: Cardiology

## 2016-11-01 DIAGNOSIS — I10 Essential (primary) hypertension: Secondary | ICD-10-CM | POA: Diagnosis not present

## 2016-11-01 DIAGNOSIS — I700 Atherosclerosis of aorta: Secondary | ICD-10-CM | POA: Diagnosis not present

## 2016-11-01 DIAGNOSIS — E119 Type 2 diabetes mellitus without complications: Secondary | ICD-10-CM | POA: Diagnosis not present

## 2016-12-20 DIAGNOSIS — I10 Essential (primary) hypertension: Secondary | ICD-10-CM | POA: Diagnosis not present

## 2016-12-20 DIAGNOSIS — I700 Atherosclerosis of aorta: Secondary | ICD-10-CM | POA: Diagnosis not present

## 2016-12-20 DIAGNOSIS — E119 Type 2 diabetes mellitus without complications: Secondary | ICD-10-CM | POA: Diagnosis not present

## 2017-01-14 DIAGNOSIS — I700 Atherosclerosis of aorta: Secondary | ICD-10-CM | POA: Diagnosis not present

## 2017-01-14 DIAGNOSIS — I10 Essential (primary) hypertension: Secondary | ICD-10-CM | POA: Diagnosis not present

## 2017-01-14 DIAGNOSIS — E119 Type 2 diabetes mellitus without complications: Secondary | ICD-10-CM | POA: Diagnosis not present

## 2017-01-24 DIAGNOSIS — I10 Essential (primary) hypertension: Secondary | ICD-10-CM | POA: Diagnosis not present

## 2017-01-24 DIAGNOSIS — E119 Type 2 diabetes mellitus without complications: Secondary | ICD-10-CM | POA: Diagnosis not present

## 2017-01-24 DIAGNOSIS — I700 Atherosclerosis of aorta: Secondary | ICD-10-CM | POA: Diagnosis not present

## 2017-01-24 DIAGNOSIS — Z6826 Body mass index (BMI) 26.0-26.9, adult: Secondary | ICD-10-CM | POA: Diagnosis not present

## 2017-02-10 DIAGNOSIS — E119 Type 2 diabetes mellitus without complications: Secondary | ICD-10-CM | POA: Diagnosis not present

## 2017-02-10 DIAGNOSIS — I10 Essential (primary) hypertension: Secondary | ICD-10-CM | POA: Diagnosis not present

## 2017-02-10 DIAGNOSIS — I700 Atherosclerosis of aorta: Secondary | ICD-10-CM | POA: Diagnosis not present

## 2017-03-04 DIAGNOSIS — E119 Type 2 diabetes mellitus without complications: Secondary | ICD-10-CM | POA: Diagnosis not present

## 2017-03-04 DIAGNOSIS — I10 Essential (primary) hypertension: Secondary | ICD-10-CM | POA: Diagnosis not present

## 2017-03-04 DIAGNOSIS — I700 Atherosclerosis of aorta: Secondary | ICD-10-CM | POA: Diagnosis not present

## 2017-03-04 IMAGING — CR DG CHEST 1V PORT
1 series · 1 of 1 positions shown · non-contrast
Comparison: Portable exam 5339 hours compared to 04/14/2015

CLINICAL DATA: Woke up by 0116 hours with heart racing and
diaphoresis, history palpitations, hypertension

EXAM:
PORTABLE CHEST - 1 VIEW

[AP]
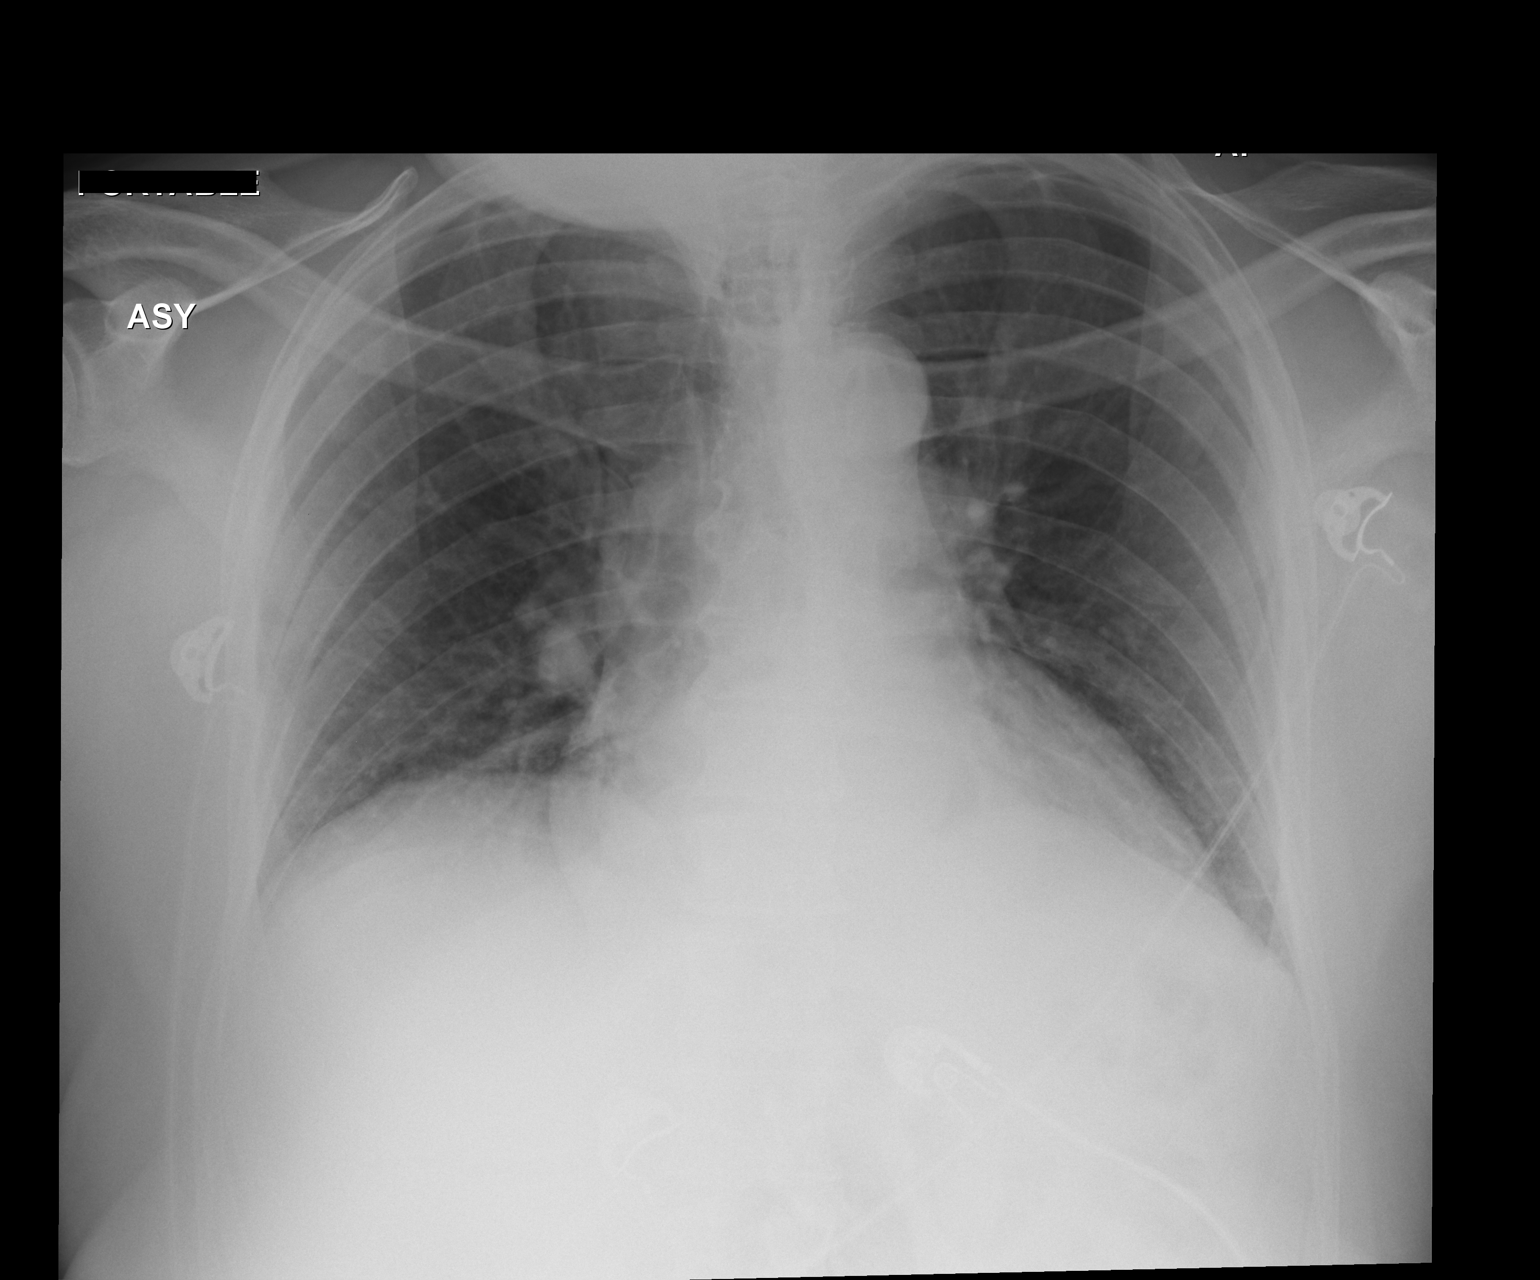

[1 of 1 positions shown; findings below may reference images not displayed]

FINDINGS: Enlargement of cardiac silhouette.

Mediastinal contours and pulmonary vascularity normal.

Minimal bibasilar atelectasis.

Lungs otherwise clear.

No pleural effusion or pneumothorax.

Minimal endplate spur formation thoracic spine.
IMPRESSION: Enlargement of cardiac silhouette with minimal bibasilar
atelectasis.

## 2017-03-12 DIAGNOSIS — E119 Type 2 diabetes mellitus without complications: Secondary | ICD-10-CM | POA: Diagnosis not present

## 2017-03-12 DIAGNOSIS — I10 Essential (primary) hypertension: Secondary | ICD-10-CM | POA: Diagnosis not present

## 2017-04-24 DIAGNOSIS — I10 Essential (primary) hypertension: Secondary | ICD-10-CM | POA: Diagnosis not present

## 2017-04-24 DIAGNOSIS — I700 Atherosclerosis of aorta: Secondary | ICD-10-CM | POA: Diagnosis not present

## 2017-04-24 DIAGNOSIS — E119 Type 2 diabetes mellitus without complications: Secondary | ICD-10-CM | POA: Diagnosis not present

## 2017-05-21 DIAGNOSIS — I10 Essential (primary) hypertension: Secondary | ICD-10-CM | POA: Diagnosis not present

## 2017-05-21 DIAGNOSIS — I700 Atherosclerosis of aorta: Secondary | ICD-10-CM | POA: Diagnosis not present

## 2017-05-21 DIAGNOSIS — E119 Type 2 diabetes mellitus without complications: Secondary | ICD-10-CM | POA: Diagnosis not present

## 2017-05-30 DIAGNOSIS — E119 Type 2 diabetes mellitus without complications: Secondary | ICD-10-CM | POA: Diagnosis not present

## 2017-05-30 DIAGNOSIS — Z6826 Body mass index (BMI) 26.0-26.9, adult: Secondary | ICD-10-CM | POA: Diagnosis not present

## 2017-05-30 DIAGNOSIS — I10 Essential (primary) hypertension: Secondary | ICD-10-CM | POA: Diagnosis not present

## 2017-05-30 DIAGNOSIS — I700 Atherosclerosis of aorta: Secondary | ICD-10-CM | POA: Diagnosis not present

## 2017-06-13 DIAGNOSIS — E119 Type 2 diabetes mellitus without complications: Secondary | ICD-10-CM | POA: Diagnosis not present

## 2017-06-13 DIAGNOSIS — I10 Essential (primary) hypertension: Secondary | ICD-10-CM | POA: Diagnosis not present

## 2017-07-01 DIAGNOSIS — Z79899 Other long term (current) drug therapy: Secondary | ICD-10-CM | POA: Diagnosis not present

## 2017-07-01 DIAGNOSIS — R1084 Generalized abdominal pain: Secondary | ICD-10-CM | POA: Diagnosis not present

## 2017-07-01 DIAGNOSIS — Z7982 Long term (current) use of aspirin: Secondary | ICD-10-CM | POA: Diagnosis not present

## 2017-07-01 DIAGNOSIS — R109 Unspecified abdominal pain: Secondary | ICD-10-CM | POA: Diagnosis not present

## 2017-07-01 DIAGNOSIS — I10 Essential (primary) hypertension: Secondary | ICD-10-CM | POA: Diagnosis not present

## 2017-07-01 DIAGNOSIS — E119 Type 2 diabetes mellitus without complications: Secondary | ICD-10-CM | POA: Diagnosis not present

## 2017-07-01 DIAGNOSIS — R1011 Right upper quadrant pain: Secondary | ICD-10-CM | POA: Diagnosis not present

## 2017-07-01 DIAGNOSIS — K859 Acute pancreatitis without necrosis or infection, unspecified: Secondary | ICD-10-CM | POA: Diagnosis not present

## 2017-07-01 DIAGNOSIS — E7800 Pure hypercholesterolemia, unspecified: Secondary | ICD-10-CM | POA: Diagnosis not present

## 2017-07-17 DIAGNOSIS — E119 Type 2 diabetes mellitus without complications: Secondary | ICD-10-CM | POA: Diagnosis not present

## 2017-07-17 DIAGNOSIS — I10 Essential (primary) hypertension: Secondary | ICD-10-CM | POA: Diagnosis not present

## 2017-07-29 DIAGNOSIS — Z6826 Body mass index (BMI) 26.0-26.9, adult: Secondary | ICD-10-CM | POA: Diagnosis not present

## 2017-07-29 DIAGNOSIS — K858 Other acute pancreatitis without necrosis or infection: Secondary | ICD-10-CM | POA: Diagnosis not present

## 2017-08-13 DIAGNOSIS — E119 Type 2 diabetes mellitus without complications: Secondary | ICD-10-CM | POA: Diagnosis not present

## 2017-08-13 DIAGNOSIS — I10 Essential (primary) hypertension: Secondary | ICD-10-CM | POA: Diagnosis not present

## 2017-09-05 DIAGNOSIS — Z6825 Body mass index (BMI) 25.0-25.9, adult: Secondary | ICD-10-CM | POA: Diagnosis not present

## 2017-09-05 DIAGNOSIS — E119 Type 2 diabetes mellitus without complications: Secondary | ICD-10-CM | POA: Diagnosis not present

## 2017-09-05 DIAGNOSIS — E784 Other hyperlipidemia: Secondary | ICD-10-CM | POA: Diagnosis not present

## 2017-09-05 DIAGNOSIS — I10 Essential (primary) hypertension: Secondary | ICD-10-CM | POA: Diagnosis not present

## 2017-09-08 ENCOUNTER — Encounter: Payer: Self-pay | Admitting: *Deleted

## 2017-09-09 ENCOUNTER — Ambulatory Visit (INDEPENDENT_AMBULATORY_CARE_PROVIDER_SITE_OTHER): Payer: PPO | Admitting: Cardiology

## 2017-09-09 ENCOUNTER — Encounter: Payer: Self-pay | Admitting: *Deleted

## 2017-09-09 ENCOUNTER — Encounter: Payer: Self-pay | Admitting: Cardiology

## 2017-09-09 VITALS — BP 129/84 | HR 54 | Ht 72.0 in | Wt 192.0 lb

## 2017-09-09 DIAGNOSIS — E782 Mixed hyperlipidemia: Secondary | ICD-10-CM

## 2017-09-09 DIAGNOSIS — I10 Essential (primary) hypertension: Secondary | ICD-10-CM

## 2017-09-09 DIAGNOSIS — R002 Palpitations: Secondary | ICD-10-CM

## 2017-09-09 NOTE — Patient Instructions (Signed)

## 2017-09-09 NOTE — Progress Notes (Signed)
Clinical Summary Jeffrey [name] is a 66 y.o.male seen today for follow up of the following medical problems.   1. Palpitations - previous 48 hr holter  showed NSR , isolated PACs and PVCs, and no arrhythmias.  - we started him on lopressor but this caused fatigue, changed to dilt and fatigue resolved - avoiding caffeine and EtoH   - no recent palpitations since our last visit  2. HTN - he is taking his medications daily  3. 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 Spending lots of time with grandchildren age 28 and 58.    Past Medical History:  Diagnosis Date  . Anxiety state, unspecified   . Encounter for long-term (current) use of other medications   . Other and unspecified hyperlipidemia   . Other malaise and fatigue   . Palpitations   . Precordial pain   . Pure hypercholesterolemia   . Shortness of breath   . Solitary pulmonary nodule   . Syncope and collapse   . Unspecified essential hypertension      No Known Allergies   Current Outpatient Prescriptions  Medication Sig Dispense Refill  . atorvastatin (LIPITOR) 20 MG tablet Take 20 mg by mouth daily.    . Cholecalciferol (VITAMIN D3) 2000 UNITS TABS Take 2,000 Units by mouth daily.     Marland Kitchen diltiazem (CARDIZEM) 60 MG tablet TAKE 1 TABLET (60 MG TOTAL) BY MOUTH 2 (TWO) TIMES DAILY. 180 tablet 3  . ferrous sulfate 325 (65 FE) MG tablet Take 325 mg by mouth 2 (two) times daily.  0  . fish oil-omega-3 fatty acids 1000 MG capsule Take 1 g by mouth daily.    Marland Kitchen lisinopril (PRINIVIL,ZESTRIL) 10 MG tablet Take 10 mg by mouth daily.     No current facility-administered medications for this visit.      Past Surgical History:  Procedure Laterality Date  . WISDOM TOOTH EXTRACTION       No Known Allergies    Family History  Problem  Relation Age of Onset  . Heart disease Mother      Social History Jeffrey [name] reports that he has never smoked. He has never used smokeless tobacco. Jeffrey [name] reports that he drinks alcohol.   Review of Systems CONSTITUTIONAL: No weight loss, fever, chills, weakness or fatigue.  HEENT: Eyes: No visual loss, blurred vision, double vision or yellow sclerae.No hearing loss, sneezing, congestion, runny nose or sore throat.  SKIN: No rash or itching.  CARDIOVASCULAR: per hpi RESPIRATORY: No shortness of breath, cough or sputum.  GASTROINTESTINAL: No anorexia, nausea, vomiting or diarrhea. No abdominal pain or blood.  GENITOURINARY: No burning on urination, no polyuria NEUROLOGICAL: No headache, dizziness, syncope, paralysis, ataxia, numbness or tingling in the extremities. No change in bowel or bladder control.  MUSCULOSKELETAL: No muscle, back pain, joint pain or stiffness.  LYMPHATICS: No enlarged nodes. No history of splenectomy.  PSYCHIATRIC: No history of depression or anxiety.  ENDOCRINOLOGIC: No reports of sweating, cold or heat intolerance. No polyuria or polydipsia.  Marland Kitchen   Physical Examination Vitals:   09/09/17 0954  BP: 129/84  Pulse: (!) 54   Vitals:   09/09/17 0954  Weight: 192 lb (87.1 kg)  Height: 6' (1.829 m)    Gen: resting comfortably, no acute distress HEENT: no scleral icterus,  pupils equal round and reactive, no palptable cervical adenopathy,  CV: RRR, no m/r/g no jvd Resp: Clear to auscultation bilaterally GI: abdomen is soft, non-tender, non-distended, normal bowel sounds, no hepatosplenomegaly MSK: extremities are warm, no edema.  Skin: warm, no rash Neuro:  no focal deficits Psych: appropriate affect   Diagnostic Studies 48 hr holter at that time showed NSR , isolated PACs and PVCs, and no arrhythmias    Assessment and Plan   1. Palpitations - doing well without significant symptmos, continue current meds  2. HTN - his bp is at goal,  continue current meds  3. Hyperlipidemia - we will contact pcp for most recent labs - cotnineu statin   F/u 1 year     Jeffrey [name], M.D., F.A.C.C.

## 2017-11-08 ENCOUNTER — Other Ambulatory Visit: Payer: Self-pay | Admitting: Cardiology

## 2017-12-09 DIAGNOSIS — E7849 Other hyperlipidemia: Secondary | ICD-10-CM | POA: Diagnosis not present

## 2017-12-09 DIAGNOSIS — I10 Essential (primary) hypertension: Secondary | ICD-10-CM | POA: Diagnosis not present

## 2017-12-09 DIAGNOSIS — E119 Type 2 diabetes mellitus without complications: Secondary | ICD-10-CM | POA: Diagnosis not present

## 2017-12-31 DIAGNOSIS — I10 Essential (primary) hypertension: Secondary | ICD-10-CM | POA: Diagnosis not present

## 2017-12-31 DIAGNOSIS — E119 Type 2 diabetes mellitus without complications: Secondary | ICD-10-CM | POA: Diagnosis not present

## 2017-12-31 DIAGNOSIS — E7849 Other hyperlipidemia: Secondary | ICD-10-CM | POA: Diagnosis not present

## 2018-03-03 DIAGNOSIS — Z0000 Encounter for general adult medical examination without abnormal findings: Secondary | ICD-10-CM | POA: Diagnosis not present

## 2018-03-03 DIAGNOSIS — Z1389 Encounter for screening for other disorder: Secondary | ICD-10-CM | POA: Diagnosis not present

## 2018-03-03 DIAGNOSIS — I10 Essential (primary) hypertension: Secondary | ICD-10-CM | POA: Diagnosis not present

## 2018-03-03 DIAGNOSIS — E119 Type 2 diabetes mellitus without complications: Secondary | ICD-10-CM | POA: Diagnosis not present

## 2018-03-03 DIAGNOSIS — Z6826 Body mass index (BMI) 26.0-26.9, adult: Secondary | ICD-10-CM | POA: Diagnosis not present

## 2018-03-03 DIAGNOSIS — E7849 Other hyperlipidemia: Secondary | ICD-10-CM | POA: Diagnosis not present

## 2018-03-03 DIAGNOSIS — Z6825 Body mass index (BMI) 25.0-25.9, adult: Secondary | ICD-10-CM | POA: Diagnosis not present

## 2018-03-11 DIAGNOSIS — E119 Type 2 diabetes mellitus without complications: Secondary | ICD-10-CM | POA: Diagnosis not present

## 2018-03-11 DIAGNOSIS — E7849 Other hyperlipidemia: Secondary | ICD-10-CM | POA: Diagnosis not present

## 2018-03-11 DIAGNOSIS — I10 Essential (primary) hypertension: Secondary | ICD-10-CM | POA: Diagnosis not present

## 2018-05-01 DIAGNOSIS — E7849 Other hyperlipidemia: Secondary | ICD-10-CM | POA: Diagnosis not present

## 2018-05-01 DIAGNOSIS — I10 Essential (primary) hypertension: Secondary | ICD-10-CM | POA: Diagnosis not present

## 2018-05-01 DIAGNOSIS — E119 Type 2 diabetes mellitus without complications: Secondary | ICD-10-CM | POA: Diagnosis not present

## 2018-06-04 DIAGNOSIS — E119 Type 2 diabetes mellitus without complications: Secondary | ICD-10-CM | POA: Diagnosis not present

## 2018-06-04 DIAGNOSIS — I10 Essential (primary) hypertension: Secondary | ICD-10-CM | POA: Diagnosis not present

## 2018-06-04 DIAGNOSIS — E7849 Other hyperlipidemia: Secondary | ICD-10-CM | POA: Diagnosis not present

## 2018-06-09 DIAGNOSIS — I10 Essential (primary) hypertension: Secondary | ICD-10-CM | POA: Diagnosis not present

## 2018-06-09 DIAGNOSIS — E7849 Other hyperlipidemia: Secondary | ICD-10-CM | POA: Diagnosis not present

## 2018-06-09 DIAGNOSIS — E119 Type 2 diabetes mellitus without complications: Secondary | ICD-10-CM | POA: Diagnosis not present

## 2018-06-09 DIAGNOSIS — Z6826 Body mass index (BMI) 26.0-26.9, adult: Secondary | ICD-10-CM | POA: Diagnosis not present

## 2018-07-28 DIAGNOSIS — H524 Presbyopia: Secondary | ICD-10-CM | POA: Diagnosis not present

## 2018-08-07 DIAGNOSIS — I10 Essential (primary) hypertension: Secondary | ICD-10-CM | POA: Diagnosis not present

## 2018-08-07 DIAGNOSIS — E119 Type 2 diabetes mellitus without complications: Secondary | ICD-10-CM | POA: Diagnosis not present

## 2018-08-07 DIAGNOSIS — E7849 Other hyperlipidemia: Secondary | ICD-10-CM | POA: Diagnosis not present

## 2018-09-11 DIAGNOSIS — I10 Essential (primary) hypertension: Secondary | ICD-10-CM | POA: Diagnosis not present

## 2018-09-11 DIAGNOSIS — E7849 Other hyperlipidemia: Secondary | ICD-10-CM | POA: Diagnosis not present

## 2018-09-11 DIAGNOSIS — E119 Type 2 diabetes mellitus without complications: Secondary | ICD-10-CM | POA: Diagnosis not present

## 2018-09-22 DIAGNOSIS — E119 Type 2 diabetes mellitus without complications: Secondary | ICD-10-CM | POA: Diagnosis not present

## 2018-09-22 DIAGNOSIS — E7849 Other hyperlipidemia: Secondary | ICD-10-CM | POA: Diagnosis not present

## 2018-09-22 DIAGNOSIS — Z6826 Body mass index (BMI) 26.0-26.9, adult: Secondary | ICD-10-CM | POA: Diagnosis not present

## 2018-09-22 DIAGNOSIS — I10 Essential (primary) hypertension: Secondary | ICD-10-CM | POA: Diagnosis not present

## 2018-09-22 DIAGNOSIS — Z0000 Encounter for general adult medical examination without abnormal findings: Secondary | ICD-10-CM | POA: Diagnosis not present

## 2018-09-22 DIAGNOSIS — Z6828 Body mass index (BMI) 28.0-28.9, adult: Secondary | ICD-10-CM | POA: Diagnosis not present

## 2018-09-22 DIAGNOSIS — Z125 Encounter for screening for malignant neoplasm of prostate: Secondary | ICD-10-CM | POA: Diagnosis not present

## 2018-10-13 DIAGNOSIS — I10 Essential (primary) hypertension: Secondary | ICD-10-CM | POA: Diagnosis not present

## 2018-10-13 DIAGNOSIS — E119 Type 2 diabetes mellitus without complications: Secondary | ICD-10-CM | POA: Diagnosis not present

## 2018-11-09 DIAGNOSIS — E119 Type 2 diabetes mellitus without complications: Secondary | ICD-10-CM | POA: Diagnosis not present

## 2018-11-09 DIAGNOSIS — I10 Essential (primary) hypertension: Secondary | ICD-10-CM | POA: Diagnosis not present

## 2018-12-07 DIAGNOSIS — I10 Essential (primary) hypertension: Secondary | ICD-10-CM | POA: Diagnosis not present

## 2018-12-07 DIAGNOSIS — E119 Type 2 diabetes mellitus without complications: Secondary | ICD-10-CM | POA: Diagnosis not present

## 2018-12-15 ENCOUNTER — Other Ambulatory Visit: Payer: Self-pay | Admitting: Cardiology

## 2019-01-06 DIAGNOSIS — I10 Essential (primary) hypertension: Secondary | ICD-10-CM | POA: Diagnosis not present

## 2019-01-06 DIAGNOSIS — E119 Type 2 diabetes mellitus without complications: Secondary | ICD-10-CM | POA: Diagnosis not present

## 2019-01-15 ENCOUNTER — Other Ambulatory Visit: Payer: Self-pay | Admitting: Cardiology

## 2019-02-17 DIAGNOSIS — E119 Type 2 diabetes mellitus without complications: Secondary | ICD-10-CM | POA: Diagnosis not present

## 2019-02-17 DIAGNOSIS — I10 Essential (primary) hypertension: Secondary | ICD-10-CM | POA: Diagnosis not present

## 2019-02-27 ENCOUNTER — Other Ambulatory Visit: Payer: Self-pay | Admitting: Cardiology

## 2019-03-16 DIAGNOSIS — I10 Essential (primary) hypertension: Secondary | ICD-10-CM | POA: Diagnosis not present

## 2019-03-16 DIAGNOSIS — E7849 Other hyperlipidemia: Secondary | ICD-10-CM | POA: Diagnosis not present

## 2019-03-16 DIAGNOSIS — Z1389 Encounter for screening for other disorder: Secondary | ICD-10-CM | POA: Diagnosis not present

## 2019-03-16 DIAGNOSIS — E119 Type 2 diabetes mellitus without complications: Secondary | ICD-10-CM | POA: Diagnosis not present

## 2019-03-16 DIAGNOSIS — Z0000 Encounter for general adult medical examination without abnormal findings: Secondary | ICD-10-CM | POA: Diagnosis not present

## 2019-03-16 DIAGNOSIS — Z6829 Body mass index (BMI) 29.0-29.9, adult: Secondary | ICD-10-CM | POA: Diagnosis not present

## 2019-03-29 ENCOUNTER — Other Ambulatory Visit: Payer: Self-pay | Admitting: Cardiology

## 2019-03-29 MED ORDER — DILTIAZEM HCL 60 MG PO TABS: 60.0000 mg | ORAL_TABLET | Freq: Two times a day (BID) | ORAL | 1 refills | Status: DC

## 2019-03-29 NOTE — Telephone Encounter (Signed)
° ° ° °  1. Which medications need to be refilled? (please list name of each medication and dose if known)   diltiazem (CARDIZEM) 60 MG    2. Which pharmacy/location (including street and city if local pharmacy) is medication to be sent to? [facility]   3. Do they need a 30 day or 90 day supply?   Patient has upcoming appointment with Dr. Harl Bowie.

## 2019-04-20 DIAGNOSIS — E7849 Other hyperlipidemia: Secondary | ICD-10-CM | POA: Diagnosis not present

## 2019-04-20 DIAGNOSIS — I10 Essential (primary) hypertension: Secondary | ICD-10-CM | POA: Diagnosis not present

## 2019-04-20 DIAGNOSIS — E119 Type 2 diabetes mellitus without complications: Secondary | ICD-10-CM | POA: Diagnosis not present

## 2019-04-21 ENCOUNTER — Other Ambulatory Visit: Payer: Self-pay | Admitting: Cardiology

## 2019-05-20 DIAGNOSIS — I10 Essential (primary) hypertension: Secondary | ICD-10-CM | POA: Diagnosis not present

## 2019-05-20 DIAGNOSIS — E119 Type 2 diabetes mellitus without complications: Secondary | ICD-10-CM | POA: Diagnosis not present

## 2019-05-20 DIAGNOSIS — E7849 Other hyperlipidemia: Secondary | ICD-10-CM | POA: Diagnosis not present

## 2019-06-02 ENCOUNTER — Telehealth: Payer: Self-pay | Admitting: Cardiology

## 2019-06-02 NOTE — Telephone Encounter (Signed)
Virtual Visit Pre-Appointment Phone Call  "(Name), I am calling you today to discuss your upcoming appointment. We are currently trying to limit exposure to the virus that causes COVID-19 by seeing patients at home rather than in the office."  1. "What is the BEST phone number to call the day of the visit?" - include this in appointment notes  2. Do you have or have access to (through a family member/friend) a smartphone with video capability that we can use for your visit?" a. If yes - list this number in appt notes as cell (if different from BEST phone #) and list the appointment type as a VIDEO visit in appointment notes b. If no - list the appointment type as a PHONE visit in appointment notes  3. Confirm consent - "In the setting of the current Covid19 crisis, you are scheduled for a (phone or video) visit with your provider on (date) at (time).  Just as we do with many in-office visits, in order for you to participate in this visit, we must obtain consent.  If you'd like, I can send this to your mychart (if signed up) or email for you to review.  Otherwise, I can obtain your verbal consent now.  All virtual visits are billed to your insurance company just like a normal visit would be.  By agreeing to a virtual visit, we'd like you to understand that the technology does not allow for your provider to perform an examination, and thus may limit your provider's ability to fully assess your condition. If your provider identifies any concerns that need to be evaluated in person, we will make arrangements to do so.  Finally, though the technology is pretty good, we cannot assure that it will always work on either your or our end, and in the setting of a video visit, we may have to convert it to a phone-only visit.  In either situation, we cannot ensure that we have a secure connection.  Are you willing to proceed?" STAFF: Did the patient verbally acknowledge consent to telehealth visit? Document  YES/NO here: yes  4. Advise patient to be prepared - "Two hours prior to your appointment, go ahead and check your blood pressure, pulse, oxygen saturation, and your weight (if you have the equipment to check those) and write them all down. When your visit starts, your provider will ask you for this information. If you have an Apple Watch or Kardia device, please plan to have heart rate information ready on the day of your appointment. Please have a pen and paper handy nearby the day of the visit as well."  5. Give patient instructions for MyChart download to smartphone OR Doximity/Doxy.me as below if video visit (depending on what platform provider is using)  6. Inform patient they will receive a phone call 15 minutes prior to their appointment time (may be from unknown caller ID) so they should be prepared to answer    TELEPHONE CALL NOTE  Jeffrey [name] has been deemed a candidate for a follow-up tele-health visit to limit community exposure during the Covid-19 pandemic. I spoke with the patient via phone to ensure availability of phone/video source, confirm preferred email & phone number, and discuss instructions and expectations.  I reminded GEORG ANG to be prepared with any vital sign and/or heart rhythm information that could potentially be obtained via home monitoring, at the time of his visit. I reminded GORDON VANDUNK to expect a phone call prior to  his visit.  Weston Anna 06/02/2019 4:18 PM   INSTRUCTIONS FOR DOWNLOADING THE MYCHART APP TO SMARTPHONE  - The patient must first make sure to have activated MyChart and know their login information - If Apple, go to [employer] and type in MyChart in the search bar and download the app. If Android, ask patient to go to [employer] and type in [place] in the search bar and download the app. The app is free but as with any other app downloads, their phone may require them to verify saved payment information or Apple/Android  password.  - The patient will need to then log into the app with their MyChart username and password, and select [facility] as their healthcare provider to link the account. When it is time for your visit, go to the MyChart app, find appointments, and click Begin Video Visit. Be sure to Select Allow for your device to access the Microphone and Camera for your visit. You will then be connected, and your provider will be with you shortly.  **If they have any issues connecting, or need assistance please contact MyChart service desk [phone])**  **If using a computer, in order to ensure the best quality for their visit they will need to use either of the following Internet Browsers: [employer], or Google Chrome**  IF USING DOXIMITY or DOXY.ME - The patient will receive a link just prior to their visit by text.     FULL LENGTH CONSENT FOR TELE-HEALTH VISIT   I hereby voluntarily request, consent and authorize [facility] and its employed or contracted physicians, physician assistants, nurse practitioners or other licensed health care professionals (the Practitioner), to provide me with telemedicine health care services (the Services") as deemed necessary by the treating Practitioner. I acknowledge and consent to receive the Services by the Practitioner via telemedicine. I understand that the telemedicine visit will involve communicating with the Practitioner through live audiovisual communication technology and the disclosure of certain medical information by electronic transmission. I acknowledge that I have been given the opportunity to request an in-person assessment or other available alternative prior to the telemedicine visit and am voluntarily participating in the telemedicine visit.  I understand that I have the right to withhold or withdraw my consent to the use of telemedicine in the course of my care at any time, without affecting my right to future care or treatment,  and that the Practitioner or I may terminate the telemedicine visit at any time. I understand that I have the right to inspect all information obtained and/or recorded in the course of the telemedicine visit and may receive copies of available information for a reasonable fee.  I understand that some of the potential risks of receiving the Services via telemedicine include:   Delay or interruption in medical evaluation due to technological equipment failure or disruption;  Information transmitted may not be sufficient (e.g. poor resolution of images) to allow for appropriate medical decision making by the Practitioner; and/or   In rare instances, security protocols could fail, causing a breach of personal health information.  Furthermore, I acknowledge that it is my responsibility to provide information about my medical history, conditions and care that is complete and accurate to the best of my ability. I acknowledge that Practitioner's advice, recommendations, and/or decision may be based on factors not within their control, such as incomplete or inaccurate data provided by me or distortions of diagnostic images or specimens that may result from electronic transmissions. I  understand that the practice of medicine is not an exact science and that Practitioner makes no warranties or guarantees regarding treatment outcomes. I acknowledge that I will receive a copy of this consent concurrently upon execution via email to the email address I last provided but may also request a printed copy by calling the office of [facility].    I understand that my insurance will be billed for this visit.   I have read or had this consent read to me.  I understand the contents of this consent, which adequately explains the benefits and risks of the Services being provided via telemedicine.   I have been provided ample opportunity to ask questions regarding this consent and the Services and have had my questions  answered to my satisfaction.  I give my informed consent for the services to be provided through the use of telemedicine in my medical care  By participating in this telemedicine visit I agree to the above.

## 2019-06-07 ENCOUNTER — Encounter: Payer: Self-pay | Admitting: *Deleted

## 2019-06-07 ENCOUNTER — Telehealth (INDEPENDENT_AMBULATORY_CARE_PROVIDER_SITE_OTHER): Payer: PPO | Admitting: Cardiology

## 2019-06-07 ENCOUNTER — Encounter: Payer: Self-pay | Admitting: Cardiology

## 2019-06-07 VITALS — BP 128/88 | HR 77 | Ht 70.0 in | Wt 199.0 lb

## 2019-06-07 DIAGNOSIS — R002 Palpitations: Secondary | ICD-10-CM

## 2019-06-07 DIAGNOSIS — E782 Mixed hyperlipidemia: Secondary | ICD-10-CM | POA: Diagnosis not present

## 2019-06-07 DIAGNOSIS — I10 Essential (primary) hypertension: Secondary | ICD-10-CM

## 2019-06-07 NOTE — Patient Instructions (Addendum)
Medication Instructions:   Your physician recommends that you continue on your current medications as directed. Please refer to the Current Medication list given to you today.  Labwork:  NONE  Testing/Procedures:  NONE  Follow-Up:  Your physician recommends that you schedule a follow-up appointment in: as needed.   Any Other Special Instructions Will Be Listed Below (If Applicable).  If you need a refill on your cardiac medications before your next appointment, please call your pharmacy. 

## 2019-06-07 NOTE — Progress Notes (Signed)
Virtual Visit via Video Note   This visit type was conducted due to national recommendations for restrictions regarding the COVID-19 Pandemic (e.g. social distancing) in an effort to limit this patient's exposure and mitigate transmission in our community.  Due to his co-morbid illnesses, this patient is at least at moderate risk for complications without adequate follow up.  This format is felt to be most appropriate for this patient at this time.  All issues noted in this document were discussed and addressed.  A limited physical exam was performed with this format.  Please refer to the patient's chart for his consent to telehealth for [facility].   Date:  06/07/2019   ID:  Jeffrey [name], DOB January 01, 1951, MRN [id number]  Patient Location: Home Provider Location: Office  PCP:  Neale Burly, MD  Cardiologist:  Carlyle Dolly, MD  Electrophysiologist:  None   Evaluation Performed:  Follow-Up Visit  Chief Complaint:  1 year follow up  History of Present Illness:    Jeffrey [name] is a 68 y.o. male seen today for follow up of the following medical problems.    1. Palpitations - previous 48 hr holter showed NSR , isolated PACs and PVCs, and no arrhythmias.  - we started him on lopressor but this caused fatigue, changed to dilt and fatigue resolved - avoiding caffeine and EtoH    - no recent palpitations. Doing well on diltiazem.   2. HTN  - compliant with meds  3. 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 Spending lots of time with grandchildren age 29 and 71.     The patient does not have symptoms concerning for COVID-19 infection (fever, chills, cough, or new shortness of breath).    Past Medical History:  Diagnosis Date  . Anxiety state, unspecified   . Encounter for long-term (current) use of  other medications   . Other and unspecified hyperlipidemia   . Other malaise and fatigue   . Palpitations   . Precordial pain   . Pure hypercholesterolemia   . Shortness of breath   . Solitary pulmonary nodule   . Syncope and collapse   . Unspecified essential hypertension    Past Surgical History:  Procedure Laterality Date  . WISDOM TOOTH EXTRACTION       Current Meds  Medication Sig  . aspirin (ASPIRIN ADULT LOW DOSE) 81 MG EC tablet Take 1 tablet by mouth daily.  Marland Kitchen atorvastatin (LIPITOR) 20 MG tablet Take 20 mg by mouth daily.  . Cholecalciferol (VITAMIN D3) 2000 UNITS TABS Take 2,000 Units by mouth daily.   Marland Kitchen diltiazem (CARDIZEM) 60 MG tablet TAKE 1 TABLET (60 MG TOTAL) BY MOUTH 2 (TWO) TIMES DAILY.  . ferrous sulfate 325 (65 FE) MG tablet Take 325 mg by mouth 2 (two) times daily.  . fish oil-omega-3 fatty acids 1000 MG capsule Take 1 g by mouth daily.  Marland Kitchen lisinopril (PRINIVIL,ZESTRIL) 10 MG tablet Take 10 mg by mouth daily.     Allergies:   Patient has no known allergies.   Social History   Tobacco Use  . Smoking status: Never Smoker  . Smokeless tobacco: Never Used  Substance Use Topics  . Alcohol use: Yes    Alcohol/week: 0.0 standard drinks    Comment: very rare  . Drug use: No  Family Hx: The patient's family history includes Heart disease in his mother.  ROS:   Please see the history of present illness.     All other systems reviewed and are negative.   Prior CV studies:   The following studies were reviewed today:  48 hr holter at that time showed NSR , isolated PACs and PVCs, and no arrhythmias  Labs/Other Tests and Data Reviewed:    EKG:  No ECG reviewed.  Recent Labs: No results found for requested labs within last 8760 hours.   Recent Lipid Panel No results found for: CHOL, TRIG, HDL, CHOLHDL, LDLCALC, LDLDIRECT  Wt Readings from Last 3 Encounters:  06/07/19 199 lb (90.3 kg)  09/09/17 192 lb (87.1 kg)  01/31/16 190 lb (86.2 kg)      Objective:    Vital Signs:  BP 128/88   Pulse 77   Ht 5\' 10"  (1.778 m)   Wt 199 lb (90.3 kg)   BMI 28.55 kg/m    Well nourished male sitting comfortably outdoors in no distress. Normal affect. Normal speech pattern and tone. No audible or visual signs of SOB or wheezing.  ASSESSMENT & PLAN:    1. Palpitations - prior monitor with just benign ectopy - has done well on diltiazem, we will continue  2. HTN - reasonable control, continue current meds  3. Hyperlipidemia - continue statin, request labs from pcp    COVID-19 Education: The signs and symptoms of COVID-19 were discussed with the patient and how to seek care for testing (follow up with PCP or arrange E-visit).  The importance of social distancing was discussed today.  Time:   Today, I have spent 12 minutes with the patient with telehealth technology discussing the above problems.     Medication Adjustments/Labs and Tests Ordered: Current medicines are reviewed at length with the patient today.  Concerns regarding medicines are outlined above.   Tests Ordered: No orders of the defined types were placed in this encounter.   Medication Changes: No orders of the defined types were placed in this encounter.   Disposition:  Follow up as needed  Signed, Carlyle Dolly, MD  06/07/2019 2:01 PM    [place]

## 2019-06-22 DIAGNOSIS — E7849 Other hyperlipidemia: Secondary | ICD-10-CM | POA: Diagnosis not present

## 2019-06-22 DIAGNOSIS — Z6829 Body mass index (BMI) 29.0-29.9, adult: Secondary | ICD-10-CM | POA: Diagnosis not present

## 2019-06-22 DIAGNOSIS — I10 Essential (primary) hypertension: Secondary | ICD-10-CM | POA: Diagnosis not present

## 2019-06-22 DIAGNOSIS — E119 Type 2 diabetes mellitus without complications: Secondary | ICD-10-CM | POA: Diagnosis not present

## 2019-07-23 DIAGNOSIS — E7849 Other hyperlipidemia: Secondary | ICD-10-CM | POA: Diagnosis not present

## 2019-07-23 DIAGNOSIS — E119 Type 2 diabetes mellitus without complications: Secondary | ICD-10-CM | POA: Diagnosis not present

## 2019-07-23 DIAGNOSIS — I10 Essential (primary) hypertension: Secondary | ICD-10-CM | POA: Diagnosis not present

## 2019-07-28 ENCOUNTER — Other Ambulatory Visit: Payer: Self-pay

## 2019-08-23 DIAGNOSIS — E7849 Other hyperlipidemia: Secondary | ICD-10-CM | POA: Diagnosis not present

## 2019-08-23 DIAGNOSIS — E119 Type 2 diabetes mellitus without complications: Secondary | ICD-10-CM | POA: Diagnosis not present

## 2019-08-23 DIAGNOSIS — I10 Essential (primary) hypertension: Secondary | ICD-10-CM | POA: Diagnosis not present

## 2019-08-27 DIAGNOSIS — E7849 Other hyperlipidemia: Secondary | ICD-10-CM | POA: Diagnosis not present

## 2019-08-27 DIAGNOSIS — E119 Type 2 diabetes mellitus without complications: Secondary | ICD-10-CM | POA: Diagnosis not present

## 2019-08-27 DIAGNOSIS — I10 Essential (primary) hypertension: Secondary | ICD-10-CM | POA: Diagnosis not present

## 2019-09-28 DIAGNOSIS — E782 Mixed hyperlipidemia: Secondary | ICD-10-CM | POA: Diagnosis not present

## 2019-09-28 DIAGNOSIS — Z6829 Body mass index (BMI) 29.0-29.9, adult: Secondary | ICD-10-CM | POA: Diagnosis not present

## 2019-09-28 DIAGNOSIS — I10 Essential (primary) hypertension: Secondary | ICD-10-CM | POA: Diagnosis not present

## 2019-09-28 DIAGNOSIS — Z0000 Encounter for general adult medical examination without abnormal findings: Secondary | ICD-10-CM | POA: Diagnosis not present

## 2019-09-28 DIAGNOSIS — E119 Type 2 diabetes mellitus without complications: Secondary | ICD-10-CM | POA: Diagnosis not present

## 2019-12-02 DIAGNOSIS — E119 Type 2 diabetes mellitus without complications: Secondary | ICD-10-CM | POA: Diagnosis not present

## 2019-12-02 DIAGNOSIS — E782 Mixed hyperlipidemia: Secondary | ICD-10-CM | POA: Diagnosis not present

## 2019-12-02 DIAGNOSIS — I10 Essential (primary) hypertension: Secondary | ICD-10-CM | POA: Diagnosis not present

## 2019-12-20 ENCOUNTER — Other Ambulatory Visit: Payer: Self-pay | Admitting: Cardiology

## 2019-12-29 DIAGNOSIS — Z6830 Body mass index (BMI) 30.0-30.9, adult: Secondary | ICD-10-CM | POA: Diagnosis not present

## 2019-12-29 DIAGNOSIS — E119 Type 2 diabetes mellitus without complications: Secondary | ICD-10-CM | POA: Diagnosis not present

## 2019-12-29 DIAGNOSIS — I10 Essential (primary) hypertension: Secondary | ICD-10-CM | POA: Diagnosis not present

## 2019-12-29 DIAGNOSIS — E782 Mixed hyperlipidemia: Secondary | ICD-10-CM | POA: Diagnosis not present

## 2019-12-29 DIAGNOSIS — M179 Osteoarthritis of knee, unspecified: Secondary | ICD-10-CM | POA: Diagnosis not present

## 2020-01-11 DIAGNOSIS — E782 Mixed hyperlipidemia: Secondary | ICD-10-CM | POA: Diagnosis not present

## 2020-01-11 DIAGNOSIS — E119 Type 2 diabetes mellitus without complications: Secondary | ICD-10-CM | POA: Diagnosis not present

## 2020-01-11 DIAGNOSIS — M179 Osteoarthritis of knee, unspecified: Secondary | ICD-10-CM | POA: Diagnosis not present

## 2020-01-11 DIAGNOSIS — I10 Essential (primary) hypertension: Secondary | ICD-10-CM | POA: Diagnosis not present

## 2020-02-15 DIAGNOSIS — M179 Osteoarthritis of knee, unspecified: Secondary | ICD-10-CM | POA: Diagnosis not present

## 2020-02-15 DIAGNOSIS — E119 Type 2 diabetes mellitus without complications: Secondary | ICD-10-CM | POA: Diagnosis not present

## 2020-02-15 DIAGNOSIS — E782 Mixed hyperlipidemia: Secondary | ICD-10-CM | POA: Diagnosis not present

## 2020-02-15 DIAGNOSIS — I10 Essential (primary) hypertension: Secondary | ICD-10-CM | POA: Diagnosis not present

## 2020-02-25 DIAGNOSIS — I10 Essential (primary) hypertension: Secondary | ICD-10-CM | POA: Diagnosis not present

## 2020-02-25 DIAGNOSIS — E119 Type 2 diabetes mellitus without complications: Secondary | ICD-10-CM | POA: Diagnosis not present

## 2020-02-25 DIAGNOSIS — E782 Mixed hyperlipidemia: Secondary | ICD-10-CM | POA: Diagnosis not present

## 2020-02-25 DIAGNOSIS — M179 Osteoarthritis of knee, unspecified: Secondary | ICD-10-CM | POA: Diagnosis not present

## 2020-03-01 DIAGNOSIS — J328 Other chronic sinusitis: Secondary | ICD-10-CM | POA: Diagnosis not present

## 2020-03-01 DIAGNOSIS — Z6829 Body mass index (BMI) 29.0-29.9, adult: Secondary | ICD-10-CM | POA: Diagnosis not present

## 2020-03-13 DIAGNOSIS — I10 Essential (primary) hypertension: Secondary | ICD-10-CM | POA: Diagnosis not present

## 2020-03-13 DIAGNOSIS — E119 Type 2 diabetes mellitus without complications: Secondary | ICD-10-CM | POA: Diagnosis not present

## 2020-04-05 DIAGNOSIS — I10 Essential (primary) hypertension: Secondary | ICD-10-CM | POA: Diagnosis not present

## 2020-04-05 DIAGNOSIS — J328 Other chronic sinusitis: Secondary | ICD-10-CM | POA: Diagnosis not present

## 2020-04-05 DIAGNOSIS — E119 Type 2 diabetes mellitus without complications: Secondary | ICD-10-CM | POA: Diagnosis not present

## 2020-04-05 DIAGNOSIS — Z6829 Body mass index (BMI) 29.0-29.9, adult: Secondary | ICD-10-CM | POA: Diagnosis not present

## 2020-04-05 DIAGNOSIS — Z0000 Encounter for general adult medical examination without abnormal findings: Secondary | ICD-10-CM | POA: Diagnosis not present

## 2020-04-05 DIAGNOSIS — E782 Mixed hyperlipidemia: Secondary | ICD-10-CM | POA: Diagnosis not present

## 2020-04-05 DIAGNOSIS — M179 Osteoarthritis of knee, unspecified: Secondary | ICD-10-CM | POA: Diagnosis not present

## 2020-04-05 DIAGNOSIS — Z1331 Encounter for screening for depression: Secondary | ICD-10-CM | POA: Diagnosis not present

## 2020-04-20 DIAGNOSIS — E782 Mixed hyperlipidemia: Secondary | ICD-10-CM | POA: Diagnosis not present

## 2020-04-20 DIAGNOSIS — E119 Type 2 diabetes mellitus without complications: Secondary | ICD-10-CM | POA: Diagnosis not present

## 2020-04-20 DIAGNOSIS — I10 Essential (primary) hypertension: Secondary | ICD-10-CM | POA: Diagnosis not present

## 2020-04-20 DIAGNOSIS — J328 Other chronic sinusitis: Secondary | ICD-10-CM | POA: Diagnosis not present

## 2020-04-24 DIAGNOSIS — E11319 Type 2 diabetes mellitus with unspecified diabetic retinopathy without macular edema: Secondary | ICD-10-CM | POA: Diagnosis not present

## 2020-05-12 DIAGNOSIS — I10 Essential (primary) hypertension: Secondary | ICD-10-CM | POA: Diagnosis not present

## 2020-05-12 DIAGNOSIS — E119 Type 2 diabetes mellitus without complications: Secondary | ICD-10-CM | POA: Diagnosis not present

## 2020-05-12 DIAGNOSIS — J328 Other chronic sinusitis: Secondary | ICD-10-CM | POA: Diagnosis not present

## 2020-05-12 DIAGNOSIS — E782 Mixed hyperlipidemia: Secondary | ICD-10-CM | POA: Diagnosis not present

## 2020-06-09 DIAGNOSIS — E119 Type 2 diabetes mellitus without complications: Secondary | ICD-10-CM | POA: Diagnosis not present

## 2020-06-09 DIAGNOSIS — I10 Essential (primary) hypertension: Secondary | ICD-10-CM | POA: Diagnosis not present

## 2020-06-09 DIAGNOSIS — J328 Other chronic sinusitis: Secondary | ICD-10-CM | POA: Diagnosis not present

## 2020-06-09 DIAGNOSIS — E782 Mixed hyperlipidemia: Secondary | ICD-10-CM | POA: Diagnosis not present

## 2020-06-20 ENCOUNTER — Other Ambulatory Visit: Payer: Self-pay | Admitting: Cardiology

## 2020-07-06 DIAGNOSIS — E119 Type 2 diabetes mellitus without complications: Secondary | ICD-10-CM | POA: Diagnosis not present

## 2020-07-06 DIAGNOSIS — I10 Essential (primary) hypertension: Secondary | ICD-10-CM | POA: Diagnosis not present

## 2020-07-06 DIAGNOSIS — E782 Mixed hyperlipidemia: Secondary | ICD-10-CM | POA: Diagnosis not present

## 2020-07-17 DIAGNOSIS — E782 Mixed hyperlipidemia: Secondary | ICD-10-CM | POA: Diagnosis not present

## 2020-07-17 DIAGNOSIS — Z0000 Encounter for general adult medical examination without abnormal findings: Secondary | ICD-10-CM | POA: Diagnosis not present

## 2020-07-17 DIAGNOSIS — M179 Osteoarthritis of knee, unspecified: Secondary | ICD-10-CM | POA: Diagnosis not present

## 2020-07-17 DIAGNOSIS — J328 Other chronic sinusitis: Secondary | ICD-10-CM | POA: Diagnosis not present

## 2020-07-17 DIAGNOSIS — E119 Type 2 diabetes mellitus without complications: Secondary | ICD-10-CM | POA: Diagnosis not present

## 2020-07-17 DIAGNOSIS — I10 Essential (primary) hypertension: Secondary | ICD-10-CM | POA: Diagnosis not present

## 2020-07-17 DIAGNOSIS — Z1331 Encounter for screening for depression: Secondary | ICD-10-CM | POA: Diagnosis not present

## 2020-07-17 DIAGNOSIS — Z6830 Body mass index (BMI) 30.0-30.9, adult: Secondary | ICD-10-CM | POA: Diagnosis not present

## 2020-08-08 DIAGNOSIS — E119 Type 2 diabetes mellitus without complications: Secondary | ICD-10-CM | POA: Diagnosis not present

## 2020-08-08 DIAGNOSIS — I10 Essential (primary) hypertension: Secondary | ICD-10-CM | POA: Diagnosis not present

## 2020-08-08 DIAGNOSIS — E782 Mixed hyperlipidemia: Secondary | ICD-10-CM | POA: Diagnosis not present

## 2020-08-23 DIAGNOSIS — R972 Elevated prostate specific antigen [PSA]: Secondary | ICD-10-CM | POA: Diagnosis not present

## 2020-08-23 DIAGNOSIS — N400 Enlarged prostate without lower urinary tract symptoms: Secondary | ICD-10-CM | POA: Diagnosis not present

## 2020-08-29 DIAGNOSIS — H524 Presbyopia: Secondary | ICD-10-CM | POA: Diagnosis not present

## 2020-08-29 DIAGNOSIS — H40013 Open angle with borderline findings, low risk, bilateral: Secondary | ICD-10-CM | POA: Diagnosis not present

## 2020-09-18 DIAGNOSIS — I10 Essential (primary) hypertension: Secondary | ICD-10-CM | POA: Diagnosis not present

## 2020-09-18 DIAGNOSIS — E782 Mixed hyperlipidemia: Secondary | ICD-10-CM | POA: Diagnosis not present

## 2020-09-18 DIAGNOSIS — E119 Type 2 diabetes mellitus without complications: Secondary | ICD-10-CM | POA: Diagnosis not present

## 2020-09-21 ENCOUNTER — Encounter: Payer: Self-pay | Admitting: Orthopaedic Surgery

## 2020-09-21 ENCOUNTER — Ambulatory Visit: Payer: Self-pay

## 2020-09-21 ENCOUNTER — Ambulatory Visit (INDEPENDENT_AMBULATORY_CARE_PROVIDER_SITE_OTHER): Payer: PPO | Admitting: Orthopaedic Surgery

## 2020-09-21 VITALS — BP 141/95 | HR 56 | Ht 71.0 in | Wt 195.0 lb

## 2020-09-21 DIAGNOSIS — M25562 Pain in left knee: Secondary | ICD-10-CM | POA: Diagnosis not present

## 2020-09-21 DIAGNOSIS — G8929 Other chronic pain: Secondary | ICD-10-CM

## 2020-09-21 NOTE — Progress Notes (Signed)
Office Visit Note   Patient: Jeffrey [name]           Date of Birth: Dec 02, 1951           MRN: [id number] Visit Date: 09/21/2020              Requested by: Neale Burly, MD [place],  [place] [id number] PCP: Neale Burly, MD   Assessment & Plan: Visit Diagnoses:  1. Chronic pain of left knee     Plan: Patient states the medication doctor Hasanaj placed him on this fixed his problem is having no current symptoms. If he develops any catching locking or increased or recurrent effusion and pain he let me know we can consider diagnostic MRI imaging. He has some mild arthritis and likely flared up his knee when he was chopping wood. I appreciate the opportunity to share in his care. He will call if he needs me.  Follow-Up Instructions: No follow-ups on file.   Orders:  Orders Placed This Encounter  Procedures  . XR KNEE 3 VIEW LEFT   No orders of the defined types were placed in this encounter.     Procedures: No procedures performed   Clinical Data: No additional findings.   Subjective: Chief Complaint  Patient presents with  . Left Knee - Pain    HPI 69 year old male has had some problems with his left knee off and on for several years. At times it is bothering him somewhat more. He states at 1 point had some problems with gout in his wrist he believes this was years ago. Recently he was cutting firewood he may have overdone it and stated left knee started giving him increased pain swelling difficulty walking. He was put on a Medrol Dosepak and states the knee is "back to normal". He is amatory has no problems with walking or mobility he is sleeping well and states is not really bothering him currently. Patient does have history of asthma diabetes and hypertension. Had torn meniscus years ago but never had surgery. He says usually when he does have pain is more medial than lateral.  Review of Systems all other systems are negative as it pertains to  HPI.   Objective: Vital Signs: BP (!) 141/95   Pulse (!) 56   Ht 5\' 11"  (1.803 m)   Wt 195 lb (88.5 kg)   BMI 27.20 kg/m   Physical Exam Constitutional:      Appearance: He is well-developed.  HENT:     Head: Normocephalic and atraumatic.  Eyes:     Pupils: Pupils are equal, round, and reactive to light.  Neck:     Thyroid: No thyromegaly.     Trachea: No tracheal deviation.  Cardiovascular:     Rate and Rhythm: Normal rate.  Pulmonary:     Effort: Pulmonary effort is normal.     Breath sounds: No wheezing.  Abdominal:     General: Bowel sounds are normal.     Palpations: Abdomen is soft.  Skin:    General: Skin is warm and dry.     Capillary Refill: Capillary refill takes less than 2 seconds.  Neurological:     Mental Status: He is alert and oriented to person, place, and time.  Psychiatric:        Behavior: Behavior normal.        Thought Content: Thought content normal.        Judgment: Judgment normal.     Ortho  Exam patient is right left mild pitting edema. No venous stasis changes pulses are palpable negative logroll to the hips negative straight leg raising negative popliteal compression test. Patient has no joint line tenderness medial lateral. Has some crepitus with knee flexion extension slightly worse on the left than right knee. Reflexes are normal. Good quad strength patellar tendon quad tendon is normal.  Specialty Comments:  No specialty comments available.  Imaging: No results found.   PMFS History: Patient Active Problem List   Diagnosis Date Noted  . Palpitations 02/25/2012   Past Medical History:  Diagnosis Date  . Anxiety state, unspecified   . Encounter for long-term (current) use of other medications   . Other and unspecified hyperlipidemia   . Other malaise and fatigue   . Palpitations   . Precordial pain   . Pure hypercholesterolemia   . Shortness of breath   . Solitary pulmonary nodule   . Syncope and collapse   . Unspecified  essential hypertension     Family History  Problem Relation Age of Onset  . Heart disease Mother     Past Surgical History:  Procedure Laterality Date  . WISDOM TOOTH EXTRACTION     Social History   Occupational History  . Occupation: [employer]  Tobacco Use  . Smoking status: Never Smoker  . Smokeless tobacco: Never Used  Substance and Sexual Activity  . Alcohol use: Yes    Alcohol/week: 0.0 standard drinks    Comment: very rare  . Drug use: No  . Sexual activity: Not on file

## 2020-10-16 DIAGNOSIS — E119 Type 2 diabetes mellitus without complications: Secondary | ICD-10-CM | POA: Diagnosis not present

## 2020-10-16 DIAGNOSIS — I10 Essential (primary) hypertension: Secondary | ICD-10-CM | POA: Diagnosis not present

## 2020-10-16 DIAGNOSIS — E782 Mixed hyperlipidemia: Secondary | ICD-10-CM | POA: Diagnosis not present

## 2020-10-23 DIAGNOSIS — Z6830 Body mass index (BMI) 30.0-30.9, adult: Secondary | ICD-10-CM | POA: Diagnosis not present

## 2020-10-23 DIAGNOSIS — I10 Essential (primary) hypertension: Secondary | ICD-10-CM | POA: Diagnosis not present

## 2020-10-23 DIAGNOSIS — M179 Osteoarthritis of knee, unspecified: Secondary | ICD-10-CM | POA: Diagnosis not present

## 2020-10-23 DIAGNOSIS — E1169 Type 2 diabetes mellitus with other specified complication: Secondary | ICD-10-CM | POA: Diagnosis not present

## 2020-10-23 DIAGNOSIS — E782 Mixed hyperlipidemia: Secondary | ICD-10-CM | POA: Diagnosis not present

## 2020-10-23 DIAGNOSIS — Z0000 Encounter for general adult medical examination without abnormal findings: Secondary | ICD-10-CM | POA: Diagnosis not present

## 2020-11-03 DIAGNOSIS — Z1159 Encounter for screening for other viral diseases: Secondary | ICD-10-CM | POA: Diagnosis not present

## 2020-11-14 DIAGNOSIS — E782 Mixed hyperlipidemia: Secondary | ICD-10-CM | POA: Diagnosis not present

## 2020-11-14 DIAGNOSIS — E1169 Type 2 diabetes mellitus with other specified complication: Secondary | ICD-10-CM | POA: Diagnosis not present

## 2020-11-14 DIAGNOSIS — I10 Essential (primary) hypertension: Secondary | ICD-10-CM | POA: Diagnosis not present

## 2020-11-17 DIAGNOSIS — E785 Hyperlipidemia, unspecified: Secondary | ICD-10-CM | POA: Diagnosis not present

## 2020-11-17 DIAGNOSIS — I739 Peripheral vascular disease, unspecified: Secondary | ICD-10-CM | POA: Diagnosis not present

## 2020-11-17 DIAGNOSIS — I10 Essential (primary) hypertension: Secondary | ICD-10-CM | POA: Diagnosis not present

## 2020-12-12 ENCOUNTER — Other Ambulatory Visit: Payer: Self-pay

## 2020-12-12 ENCOUNTER — Encounter: Payer: Self-pay | Admitting: Urology

## 2020-12-12 ENCOUNTER — Ambulatory Visit (INDEPENDENT_AMBULATORY_CARE_PROVIDER_SITE_OTHER): Payer: PPO | Admitting: Urology

## 2020-12-12 VITALS — BP 134/86 | HR 66 | Temp 98.3°F | Ht 71.0 in | Wt 205.0 lb

## 2020-12-12 DIAGNOSIS — E782 Mixed hyperlipidemia: Secondary | ICD-10-CM | POA: Diagnosis not present

## 2020-12-12 DIAGNOSIS — N400 Enlarged prostate without lower urinary tract symptoms: Secondary | ICD-10-CM | POA: Diagnosis not present

## 2020-12-12 DIAGNOSIS — I10 Essential (primary) hypertension: Secondary | ICD-10-CM | POA: Diagnosis not present

## 2020-12-12 DIAGNOSIS — R972 Elevated prostate specific antigen [PSA]: Secondary | ICD-10-CM | POA: Diagnosis not present

## 2020-12-12 DIAGNOSIS — E1169 Type 2 diabetes mellitus with other specified complication: Secondary | ICD-10-CM | POA: Diagnosis not present

## 2020-12-12 LAB — URINALYSIS, ROUTINE W REFLEX MICROSCOPIC
Bilirubin, UA: NEGATIVE
Glucose, UA: NEGATIVE
Ketones, UA: NEGATIVE
Leukocytes,UA: NEGATIVE
Nitrite, UA: NEGATIVE
Protein,UA: NEGATIVE
RBC, UA: NEGATIVE
Specific Gravity, UA: <1.005 — ABNORMAL LOW (ref 1.005–1.030)
Urobilinogen, Ur: 0.2 mg/dL (ref 0.2–1.0)
pH, UA: 5 (ref 5.0–7.5)

## 2020-12-12 MED ORDER — LEVOFLOXACIN 750 MG PO TABS: 750.0000 mg | ORAL_TABLET | Freq: Every day | ORAL | 0 refills | Status: AC

## 2020-12-12 NOTE — Patient Instructions (Addendum)
   Appointment Time: 8:15 Appointment Date: 01/17/20  Location: [facility] Department   Prostate Biopsy Instructions  Stop all aspirin or blood thinners (aspirin, plavix, coumadin, warfarin, motrin, ibuprofen, advil, aleve, naproxen, naprosyn) for 7 days prior to the procedure.  If you have any questions about stopping these medications, please contact your primary care physician or cardiologist.  Having a light meal prior to the procedure is recommended.  If you are diabetic or have low blood sugar please bring a small snack or glucose tablet.  A Fleets enema is needed to be purchased over the counter at a local pharmacy and used 2 hours before you scheduled appointment.  This can be purchased over the counter at any pharmacy.  Antibiotics will be administered in the clinic at the time of the procedure and 1 tablet has been sent to your pharmacy. Please take the antibiotic as prescribed.    Please bring someone with you to the procedure to drive you home if you are given a valium to take prior to your procedure.   If you have any questions or concerns, please feel free to call the office at [phone] or send a Mychart message.    Thank you, [facility]

## 2020-12-12 NOTE — Progress Notes (Signed)
Urological Symptom Review  Patient is experiencing the following symptoms: Erection problems (male only)   Review of Systems  Gastrointestinal (upper)  : Negative for upper GI symptoms  Gastrointestinal (lower) : Negative for lower GI symptoms  Constitutional : Negative for symptoms  Skin: Itching  Eyes: Blurred vision  Ear/Nose/Throat : Negative for Ear/Nose/Throat symptoms  Hematologic/Lymphatic: Negative for Hematologic/Lymphatic symptoms  Cardiovascular : Negative for cardiovascular symptoms  Respiratory : Negative for respiratory symptoms  Endocrine: Negative for endocrine symptoms  Musculoskeletal: Joint pain  Neurological: Negative for neurological symptoms  Psychologic: Negative for psychiatric symptoms

## 2020-12-12 NOTE — Progress Notes (Signed)
H&P  Chief Complaint: Elevated PSA  History of Present Illness:   12.14.2021: Pt here for second opinion prior to undergoing TRUSP/bx. He denies any significant LUTS at this time  08.25.2021 - PSA: 5.88  (below copied from [facility] records):  08.25.2021: 69 year old male referred for an elevated PSA of 4.5 on 07/17/2020. Baseline PSA in 2019  2.7. He has no family history of prostate cancer. No voiding complaints. Denies any hematuria dysuria. Urinalysis is negative. He does have some itching in his groin off and on that is not bothersome. This only occurs at night.   Past Medical History:  Diagnosis Date  . Anxiety state, unspecified   . Encounter for long-term (current) use of other medications   . Other and unspecified hyperlipidemia   . Other malaise and fatigue   . Palpitations   . Precordial pain   . Pure hypercholesterolemia   . Shortness of breath   . Solitary pulmonary nodule   . Syncope and collapse   . Unspecified essential hypertension     Past Surgical History:  Procedure Laterality Date  . WISDOM TOOTH EXTRACTION      Home Medications:  Allergies as of 12/12/2020   No Known Allergies     Medication List       Accurate as of December 12, 2020 12:56 PM. If you have any questions, ask your nurse or doctor.        Aspirin Adult Low Dose 81 MG EC tablet Generic drug: aspirin Take 1 tablet by mouth daily.   atorvastatin 20 MG tablet Commonly known as: LIPITOR Take 20 mg by mouth daily.   diltiazem 60 MG tablet Commonly known as: CARDIZEM TAKE 1 TABLET (60 MG TOTAL) BY MOUTH 2 (TWO) TIMES DAILY.   ferrous sulfate 325 (65 FE) MG tablet Take 325 mg by mouth 2 (two) times daily.   fish oil-omega-3 fatty acids 1000 MG capsule Take 1 g by mouth daily.   lisinopril 10 MG tablet Commonly known as: ZESTRIL Take 10 mg by mouth daily.   Vitamin D3 50 MCG (2000 UT) Tabs Take 2,000 Units by mouth daily.       Allergies: No Known Allergies  Family  History  Problem Relation Age of Onset  . Heart disease Mother     Social History:  reports that he has never smoked. He has never used smokeless tobacco. He reports current alcohol use. He reports that he does not use drugs.  ROS: A complete review of systems was performed.  All systems are negative except for pertinent findings as noted.  Physical Exam:  Vital signs in last 24 hours: There were no vitals taken for this visit. Constitutional:  Alert and oriented, No acute distress Cardiovascular: Regular rate  Respiratory: Normal respiratory effort GI: Abdomen is soft, nontender, nondistended, no abdominal masses. No CVAT. No hernias Genitourinary: Normal male phallus--uncirc, testes are descended bilaterally and non-tender and without masses, scrotum is normal in appearance without lesions or masses, perineum is normal on inspection. Prostate feels about 60 grams. Neurologic: Grossly intact, no focal deficits Psychiatric: Normal mood and affect  I have reviewed prior pt notes  I have reviewed notes from referring/previous physicians  I have reviewed urinalysis results  I have independently reviewed prior imaging  I have reviewed prior PSA results   Impression/Assessment:  1. Pt has an elevated PSA result w/ a rising trend. He should undergo TRUSP/bx at this time time assess his PCa risk.   2. BPH w/o sx's--palpate ~  60 mL  Plan:  1. Pt advised regarding PSA monitoring/significance &  TRUSP/bx procedure/risks/complications.  2. PSA taken today to further establish trend  3. F/U for TRUSP/bx at next available appointment.  CC: Dr. Stoney Bang

## 2020-12-13 ENCOUNTER — Other Ambulatory Visit: Payer: Self-pay | Admitting: Urology

## 2020-12-13 DIAGNOSIS — R972 Elevated prostate specific antigen [PSA]: Secondary | ICD-10-CM

## 2020-12-13 LAB — PSA: Prostate Specific Ag, Serum: 4.3 ng/mL — ABNORMAL HIGH (ref 0.0–4.0)

## 2021-01-04 ENCOUNTER — Other Ambulatory Visit: Payer: Self-pay | Admitting: Cardiology

## 2021-01-08 NOTE — Procedures (Signed)
Risks, benefits, and some of the potential complications of a transrectal ultrasounds of the prostate (TRUSP) with biopsies were discussed at length with the patient including gross hematuria, blood in the bowel movements, hematospermia, bacteremia, infection, voiding discomfort, urinary retention, fever, chills, sepsis, blood transfusion, death, and others. All questions were answered. Informed consent was obtained. The patient confirmed that he had taken his pre-procedure antibiotic. All anticoagulants were discontinued prior to the procedure. The patient emptied his bladder. He was positioned in a comfortable left lateral decubitus position with hips and knees acutely flexed.  The rectal probe was inserted into the rectum without difficulty. 10cc of 2% Lidocaine without epinephrine was instilled with a spinal needle using ultrasound guidance near the junction of each seminal vesicle and the prostate.  Sequential transverse (axial) scans were made in small increments beginning at the seminal vesicles and ending at the prostatic apex. Sequential longitudinal (saggital) scans were made in small increments beginning at the right lateral prostate and ending at the left lateral prostate. Excellent anatomical imaging was obtained. The peripheral, transitional, and central zones were well-defined. The seminal vesicles were normal.  Prostate volume 46.4 ml.  There were no hypoechoic areas. 12 needle core biopsies were performed. 1 biopsy each was taken from the following areas:  Right lateral base, right medial base, right lateral mid prostate, right medial mid prostate, right lateral apical prostate, right medial apical prostate, left lateral base, left medial base, left lateral mid prostate, left medial mid prostate, left lateral apical prostate, left medial apical prostate.. Minimal prostatic calcifications were noted. Excellent biopsy specimens were obtained.  Follow-up rectal examination was unremarkable.  The procedure was well-tolerated and without complications. Antibiotic instructions were given. The patient was told that:  For several days:  he should increase his fluid intake and limit strenuous activity  he might have mild discomfort at the base of his penis or in his rectum  he might have blood in his urine or blood in his bowel movements  For 2-3 months:  he might have blood in his ejaculate (semen)  Instructions were given to call the office immedicately for blood clots in the urine or bowel movements, difficulty urinating, inability to urinate, urinary retention, painful or frequent urination, fever, chills, nausea, vomiting, or other illness. The patient stated that he understood these instructions and would comply with them. We told the patient that prostate biopsy pathology reports are usually available within 3-5 working days, unless a pathologic second opinion is required, which may take 7-14 days. We told him to contact us to check on the status of his biopsy if he has not heard from [place] within 7 days. The patient left the ultrasound examination room in stable condition.

## 2021-01-09 ENCOUNTER — Ambulatory Visit: Payer: PPO | Admitting: Urology

## 2021-01-09 ENCOUNTER — Encounter (HOSPITAL_COMMUNITY): Payer: Self-pay

## 2021-01-09 ENCOUNTER — Other Ambulatory Visit: Payer: Self-pay | Admitting: Urology

## 2021-01-09 ENCOUNTER — Other Ambulatory Visit: Payer: Self-pay

## 2021-01-09 ENCOUNTER — Ambulatory Visit (HOSPITAL_COMMUNITY)
Admission: RE | Admit: 2021-01-09 | Discharge: 2021-01-09 | Disposition: A | Discharge status: Home / Self Care | Payer: PPO | Source: Ambulatory Visit | Attending: Urology | Admitting: Urology

## 2021-01-09 DIAGNOSIS — C61 Malignant neoplasm of prostate: Secondary | ICD-10-CM | POA: Diagnosis not present

## 2021-01-09 DIAGNOSIS — R972 Elevated prostate specific antigen [PSA]: Secondary | ICD-10-CM

## 2021-01-09 MED ORDER — GENTAMICIN SULFATE 40 MG/ML IJ SOLN
INTRAMUSCULAR | Status: AC
  Administered 2021-01-09: 160 mg via INTRAMUSCULAR
  Filled 2021-01-09: qty 4

## 2021-01-09 MED ORDER — GENTAMICIN SULFATE 40 MG/ML IJ SOLN: 160.0000 mg | Freq: Once | INTRAMUSCULAR | Status: AC

## 2021-01-09 MED ORDER — LIDOCAINE HCL (PF) 2 % IJ SOLN
INTRAMUSCULAR | Status: AC
  Administered 2021-01-09: 10 mL
  Filled 2021-01-09: qty 10

## 2021-01-09 MED ORDER — LIDOCAINE HCL (PF) 2 % IJ SOLN: 10.0000 mL | Freq: Once | INTRAMUSCULAR | Status: AC

## 2021-01-09 NOTE — Sedation Documentation (Signed)
PT tolerated prostate biopsy procedure and IM antibiotic injections well today. Labs obtained and sent for pathology. PT ambulatory at discharge with no acute distress noted and verbalized understanding of discharge instructions. PT to follow up with urologist as scheduled.

## 2021-01-09 NOTE — Discharge Instructions (Signed)

## 2021-01-16 ENCOUNTER — Other Ambulatory Visit (HOSPITAL_COMMUNITY): Payer: PPO

## 2021-01-16 ENCOUNTER — Other Ambulatory Visit: Payer: PPO | Admitting: Urology

## 2021-01-22 DIAGNOSIS — E782 Mixed hyperlipidemia: Secondary | ICD-10-CM | POA: Diagnosis not present

## 2021-01-22 DIAGNOSIS — Z0000 Encounter for general adult medical examination without abnormal findings: Secondary | ICD-10-CM | POA: Diagnosis not present

## 2021-01-22 DIAGNOSIS — Z6830 Body mass index (BMI) 30.0-30.9, adult: Secondary | ICD-10-CM | POA: Diagnosis not present

## 2021-01-22 DIAGNOSIS — E1169 Type 2 diabetes mellitus with other specified complication: Secondary | ICD-10-CM | POA: Diagnosis not present

## 2021-01-22 DIAGNOSIS — M179 Osteoarthritis of knee, unspecified: Secondary | ICD-10-CM | POA: Diagnosis not present

## 2021-01-22 DIAGNOSIS — I10 Essential (primary) hypertension: Secondary | ICD-10-CM | POA: Diagnosis not present

## 2021-01-30 ENCOUNTER — Ambulatory Visit (INDEPENDENT_AMBULATORY_CARE_PROVIDER_SITE_OTHER): Payer: PPO | Admitting: Urology

## 2021-01-30 ENCOUNTER — Other Ambulatory Visit: Payer: Self-pay

## 2021-01-30 ENCOUNTER — Encounter: Payer: Self-pay | Admitting: Urology

## 2021-01-30 VITALS — BP 147/88 | HR 65 | Temp 98.5°F | Ht 70.0 in | Wt 210.0 lb

## 2021-01-30 DIAGNOSIS — C61 Malignant neoplasm of prostate: Secondary | ICD-10-CM

## 2021-01-30 NOTE — Progress Notes (Signed)
H&P  Chief Complaint: PCa  History of Present Illness:  2.1.2022:   TRUSP/Bx 1.11.2022:  Prostate volume 46.4 mL. PSAD 0.09. Staging: T1c GG: 3+4 (Right Apex) Positive cores/Total cores: 1/12 PSA: 4.3 MSKCC nomo:  OCD: 76% EE: 22% LNI: 2% SVI: 1%  (below copied from [facility] records):  08.25.2021: 70 year old male referred for an elevated PSA of 4.5 on 07/17/2020. Baseline PSA in 2019  2.7. He has no family history of prostate cancer. No voiding complaints. Denies any hematuria dysuria. Urinalysis is negative. He does have some itching in his groin off and on that is not bothersome. This only occurs at night.  12.14.2021: Pt here for second opinion prior to undergoing TRUSP/bx. He denies any significant LUTS at this time  08.25.2021 - PSA: 5.88  Past Medical History:  Diagnosis Date  . Anxiety state, unspecified   . Diabetes mellitus without complication ([place])   . Encounter for long-term (current) use of other medications   . Gout   . Hyperchloremia arthrit  . Other and unspecified hyperlipidemia   . Other malaise and fatigue   . Palpitations   . Precordial pain   . Pure hypercholesterolemia   . Shortness of breath   . Solitary pulmonary nodule   . Syncope and collapse   . Unspecified essential hypertension     Past Surgical History:  Procedure Laterality Date  . WISDOM TOOTH EXTRACTION      Home Medications:  Allergies as of 01/30/2021   No Known Allergies     Medication List       Accurate as of January 30, 2021 12:23 PM. If you have any questions, ask your nurse or doctor.        aspirin 81 MG EC tablet Take 1 tablet by mouth daily.   atorvastatin 20 MG tablet Commonly known as: LIPITOR Take 20 mg by mouth daily.   diltiazem 60 MG tablet Commonly known as: CARDIZEM TAKE 1 TABLET (60 MG TOTAL) BY MOUTH 2 (TWO) TIMES DAILY.   ferrous sulfate 325 (65 FE) MG tablet Take 325 mg by mouth 2 (two) times daily.   fish oil-omega-3 fatty acids 1000 MG  capsule Take 1 g by mouth daily.   lisinopril 10 MG tablet Commonly known as: ZESTRIL Take 10 mg by mouth daily.   OneTouch Verio test strip Generic drug: glucose blood daily.   Vitamin D3 50 MCG (2000 UT) Tabs Take 2,000 Units by mouth daily.       Allergies: No Known Allergies  Family History  Problem Relation Age of Onset  . Heart disease Mother     Social History:  reports that he has never smoked. He has never used smokeless tobacco. He reports current alcohol use. He reports that he does not use drugs.  ROS: A complete review of systems was performed.  All systems are negative except for pertinent findings as noted.  Physical Exam:  Vital signs in last 24 hours: There were no vitals taken for this visit. Constitutional:  Alert and oriented, No acute distress Cardiovascular: Regular rate  Respiratory: Normal respiratory effort Neurologic: Grossly intact, no focal deficits Psychiatric: Normal mood and affect  I have reviewed prior pt notes  I have reviewed notes from referring/previous physicians  I have reviewed urinalysis results  I have independently reviewed prior imaging  I have reviewed prior PSA/pathology results   Impression/Assessment:  New diagnosis of very low volume, favorable intermediate risk prostate cancer.  Plan:  The patient was  counseled about the  natural history of prostate cancer and the standard treatment options that are available for prostate cancer. It was explained to him how his age and life expectancy, clinical stage, Gleason score, and PSA affect his prognosis, the decision to proceed with additional staging studies, as well as how that information influences recommended treatment strategies. We discussed the roles for active surveillance, radiation therapy, surgical therapy, androgen deprivation, as well as ablative therapy options for the treatment of prostate cancer as appropriate to his individual cancer situation. We  discussed the risks and benefits of these options with regard to their impact on cancer control and also in terms of potential adverse events, complications, and impact on quality of life particularly related to urinary, bowel, and sexual function. The patient was encouraged to ask questions throughout the discussion today and all questions were answered to his stated satisfaction. In addition, the patient was provided with and/or directed to appropriate resources and literature for further education about prostate cancer and treatment options.  With the patient having very low volume favorable risk prostate cancer, and being the age of 70, I have strongly recommended we at least start by following him with active surveillance.  He did have appropriate questions, and would like to go home and talk about this with his wife and family.  I will have him come back in about 6 weeks for repeat talk.

## 2021-03-13 ENCOUNTER — Other Ambulatory Visit: Payer: PPO

## 2021-03-13 ENCOUNTER — Other Ambulatory Visit: Payer: Self-pay

## 2021-03-13 DIAGNOSIS — C61 Malignant neoplasm of prostate: Secondary | ICD-10-CM

## 2021-03-14 LAB — PSA: Prostate Specific Ag, Serum: 4.3 ng/mL — ABNORMAL HIGH (ref 0.0–4.0)

## 2021-03-19 NOTE — Progress Notes (Signed)
History of Present Illness: He is here today for discussion on prostate cancer follow-up.  We had a discussion at his last visit, and I strongly recommended active surveillance, is only 5% of 1 core revealed GG 2 prostate cancer.  This is very low volume disease.  He has been counseled by his primary care provider that he should have brachytherapy.  He has several more questions today.   TRUSP/Bx 1.11.2022:  Prostate volume 46.4 mL. PSAD 0.09. Staging: T1c GG: 3+4 (Right Apex) Positive cores/Total cores: 1/12 PSA: 4.3 MSKCC nomo:  OCD: 76% EE: 22% LNI: 2% SVI: 1%   Past Medical History:  Diagnosis Date  . Anxiety state, unspecified   . Diabetes mellitus without complication ([place])   . Encounter for long-term (current) use of other medications   . Gout   . Hyperchloremia arthrit  . Other and unspecified hyperlipidemia   . Other malaise and fatigue   . Palpitations   . Precordial pain   . Pure hypercholesterolemia   . Shortness of breath   . Solitary pulmonary nodule   . Syncope and collapse   . Unspecified essential hypertension     Past Surgical History:  Procedure Laterality Date  . WISDOM TOOTH EXTRACTION      Home Medications:  Allergies as of 03/20/2021   No Known Allergies     Medication List       Accurate as of March 19, 2021 11:50 AM. If you have any questions, ask your nurse or doctor.        aspirin 81 MG EC tablet Take 1 tablet by mouth daily.   atorvastatin 20 MG tablet Commonly known as: LIPITOR Take 20 mg by mouth daily.   diltiazem 60 MG tablet Commonly known as: CARDIZEM TAKE 1 TABLET (60 MG TOTAL) BY MOUTH 2 (TWO) TIMES DAILY.   ferrous sulfate 325 (65 FE) MG tablet Take 325 mg by mouth 2 (two) times daily.   fish oil-omega-3 fatty acids 1000 MG capsule Take 1 g by mouth daily.   lisinopril 10 MG tablet Commonly known as: ZESTRIL Take 10 mg by mouth daily.   OneTouch Verio test strip Generic drug: glucose blood daily.    Vitamin D3 50 MCG (2000 UT) Tabs Take 2,000 Units by mouth daily.       Allergies: No Known Allergies  Family History  Problem Relation Age of Onset  . Heart disease Mother     Social History:  reports that he has never smoked. He has never used smokeless tobacco. He reports current alcohol use. He reports that he does not use drugs.  ROS: A complete review of systems was performed.  All systems are negative except for pertinent findings as noted.  Physical Exam:  Vital signs in last 24 hours: There were no vitals taken for this visit. Constitutional:  Alert and oriented, No acute distress Cardiovascular: Regular rate  Respiratory: Normal respiratory effort   I have reviewed prior pt notes  I have reviewed notes from referring/previous physicians  I have reviewed urinalysis results  I have reviewed prior PSA/pathology results   Impression/Assessment:  Very low volume favorable intermediate risk prostate cancer.  Patient, I think, is an excellent candidate for active surveillance.  However, he has been counseled that he should have brachytherapy.  Plan:  I again discussed risks and benefits of brachytherapy as well as active surveillance.  Certainly, with his GG 2 disease, I do not necessarily think brachytherapy is the wrong choice, but in a 70 year old  with such low volume disease, there is very slim risk of progression before treatment is administered with active surveillance.  I have suggested that he have a talk with Dr. Tammi Klippel at the regional [place] for further consultation.  He wants to do that.

## 2021-03-20 ENCOUNTER — Other Ambulatory Visit: Payer: Self-pay

## 2021-03-20 ENCOUNTER — Ambulatory Visit (INDEPENDENT_AMBULATORY_CARE_PROVIDER_SITE_OTHER): Payer: PPO | Admitting: Urology

## 2021-03-20 ENCOUNTER — Encounter: Payer: Self-pay | Admitting: Urology

## 2021-03-20 VITALS — BP 133/83 | HR 62

## 2021-03-20 DIAGNOSIS — C61 Malignant neoplasm of prostate: Secondary | ICD-10-CM | POA: Diagnosis not present

## 2021-03-20 NOTE — Progress Notes (Signed)

## 2021-03-30 ENCOUNTER — Encounter: Payer: Self-pay | Admitting: Radiation Oncology

## 2021-03-30 NOTE — Progress Notes (Signed)
GU Location of Tumor / Histology: prostatic adenocarcinoma  If Prostate Cancer, Gleason Score is (3 + 4) and PSA is (5.88). Prostate volume: 46.4 mL  Jeffrey [name] presented to Dr. Diona Fanti for a second opinion prior to prostate biopsy for PSA trending up.   8.25.21 psa 5.88 7.19.21 psa 4.5 2019  psa 2.7  Biopsies of prostate (if applicable) revealed:    Past/Anticipated interventions by urology, if any: prostate biopsy, referral to Dr. Tammi Klippel to discuss brachytherapy  Past/Anticipated interventions by medical oncology, if any: no  Weight changes, if any: no  Bowel/Bladder complaints, if any:  IPSS 1. SHIM 3. Denies dysuria, hematuria, urinary leakage or incontinence. Denies any bowel complaints.  Nausea/Vomiting, if any: no  Pain issues, if any:  Reports left shoulder pain intermittent mostly after yard work.  SAFETY ISSUES:  Prior radiation? denies  Pacemaker/ICD? denies  Possible current pregnancy? no, male patient  Is the patient on methotrexate? no  Current Complaints / other details:  70 year old male. Married to [place] with two children.   Dr. Diona Fanti recommended active surveillance but, PCP encouraged brachytherapy.

## 2021-04-02 ENCOUNTER — Ambulatory Visit
Admission: RE | Admit: 2021-04-02 | Discharge: 2021-04-02 | Disposition: A | Discharge status: Home / Self Care | Payer: PPO | Source: Ambulatory Visit | Attending: Radiation Oncology | Admitting: Radiation Oncology

## 2021-04-02 ENCOUNTER — Other Ambulatory Visit: Payer: Self-pay

## 2021-04-02 ENCOUNTER — Encounter: Payer: Self-pay | Admitting: Radiation Oncology

## 2021-04-02 VITALS — Ht 71.0 in | Wt 203.0 lb

## 2021-04-02 DIAGNOSIS — C61 Malignant neoplasm of prostate: Secondary | ICD-10-CM | POA: Insufficient documentation

## 2021-04-02 HISTORY — DX: Malignant neoplasm of prostate: C61

## 2021-04-02 NOTE — Progress Notes (Signed)
Radiation Oncology         [phone] ________________________________  Initial Outpatient Consultation - Conducted via face-to-face video due to current COVID-19 concerns for limiting patient exposure  Name: Jeffrey [name] MRN: [id number]  Date: 04/02/2021  DOB: 03-06-1951  [id number], Samul Dada, MD  Franchot Gallo, MD   REFERRING PHYSICIAN: Franchot Gallo, MD  DIAGNOSIS: 70 y.o. gentleman with stage T1c adenocarcinoma of the prostate with a Gleason's score of 3+4 and a PSA of 4.3    ICD-10-CM   1. Malignant neoplasm of prostate ([place])  C61     HISTORY OF PRESENT ILLNESS: This visit was conducted via video visit to spare the patient unnecessary potential exposure in the healthcare setting during the current COVID-19 pandemic. :Jeffrey [name] is a 70 y.o. gentleman.  He was noted to have an elevated PSA of 4.5 by his primary care physician, Dr. Sherrie Sport.  Accordingly, he was referred for evaluation in urology by Dr. Diona Fanti on 12/12/20,  digital rectal examination was performed at that time revealing a 60 gm.  The patient proceeded to transrectal ultrasound with 12 biopsies of the prostate on 01/09/21.  The prostate volume measured 46 cc.  Out of 12 core biopsies, on was positive.  The maximum Gleason score was 3+4, and this was seen in right apex.  The patient reviewed the biopsy results with his urologist and he has kindly been referred today for discussion of potential radiation treatment options.  PREVIOUS RADIATION THERAPY: No  PAST MEDICAL HISTORY:  has a past medical history of Anxiety state, unspecified, Diabetes mellitus without complication ([place]), Encounter for long-term (current) use of other medications, Gout, Hyperchloremia (arthrit), Other and unspecified hyperlipidemia, Other malaise and fatigue, Palpitations, Precordial pain, Prostate cancer ([place]), Pure hypercholesterolemia, Shortness of breath, Solitary pulmonary nodule, Syncope and collapse, and Unspecified  essential hypertension.    PAST SURGICAL HISTORY: Past Surgical History:  Procedure Laterality Date  . PROSTATE BIOPSY    . WISDOM TOOTH EXTRACTION      FAMILY HISTORY: family history includes Heart disease in his mother.  SOCIAL HISTORY:  reports that he has never smoked. He has never used smokeless tobacco. He reports current alcohol use. He reports that he does not use drugs.  ALLERGIES: Patient has no known allergies.  MEDICATIONS:  Current Outpatient Medications  Medication Sig Dispense Refill  . atorvastatin (LIPITOR) 20 MG tablet Take 20 mg by mouth daily.    . Cholecalciferol (VITAMIN D3) 2000 UNITS TABS Take 2,000 Units by mouth daily.     Marland Kitchen diltiazem (CARDIZEM) 60 MG tablet TAKE 1 TABLET (60 MG TOTAL) BY MOUTH 2 (TWO) TIMES DAILY. 180 tablet 3  . ferrous sulfate 325 (65 FE) MG tablet Take 325 mg by mouth 2 (two) times daily.  0  . fish oil-omega-3 fatty acids 1000 MG capsule Take 1 g by mouth daily.    Marland Kitchen lisinopril (PRINIVIL,ZESTRIL) 10 MG tablet Take 10 mg by mouth daily.    Glory Rosebush VERIO test strip daily.     No current facility-administered medications for this encounter.    REVIEW OF SYSTEMS:  A 15 point review of systems is documented in the electronic medical record. This was obtained by the nursing staff. However, I reviewed this with the patient to discuss relevant findings and make appropriate changes.  Pertinent items are noted in HPI..  The patient completed an IPSS and IIEF questionnaire.  His IPSS score was 1 indicating mild urinary outflow obstructive symptoms.  He indicated that his  erectile function is unable to complete sexual activity based on SHIM of 3.   PHYSICAL EXAM: This patient is in no acute distress.  He is alert and oriented. I was able to assess the patient over video using Caregility     He exhibits no respiratory distress or labored breathing.  He appears neurologically intact.  His mood is pleasant.  His affect is appropriate.  Please note  the digital rectal exam findings described above.  KPS = 100  100 - Normal; no complaints; no evidence of disease. 90   - Able to carry on normal activity; minor signs or symptoms of disease. 80   - Normal activity with effort; some signs or symptoms of disease. 92   - Cares for self; unable to carry on normal activity or to do active work. 60   - Requires occasional assistance, but is able to care for most of his personal needs. 50   - Requires considerable assistance and frequent medical care. 42   - Disabled; requires special care and assistance. 42   - Severely disabled; hospital admission is indicated although death not imminent. 21   - Very sick; hospital admission necessary; active supportive treatment necessary. 10   - Moribund; fatal processes progressing rapidly. 0     - Dead  Karnofsky DA, Abelmann [place], Craver LS and Burchenal [facility] [phone]) The use of the nitrogen mustards in the palliative treatment of carcinoma: with particular reference to bronchogenic carcinoma Cancer 1 [id number]   LABORATORY DATA:  Lab Results  Component Value Date   HGB 14.6 05/09/2015   HCT 43.0 05/09/2015   Lab Results  Component Value Date   NA 140 05/09/2015   K 4.4 05/09/2015   CL 105 05/09/2015   No results found for: ALT, AST, GGT, ALKPHOS, BILITOT   RADIOGRAPHY: No results found.    IMPRESSION: This patient is a 70 y.o. gentleman with stage T1c adenocarcinoma of the prostate with a Gleason's score of 3+4 and a PSA of 4.3.  His T-Stage, Gleason's Score, and PSA put him into the favorable intermediate risk group.  In addtion, it should be noted that he has very low volume disease based on only 5% involvement of one out of 12 core biopsies.  He is eligible for a variety of potential treatment options including prostatectomy, external radiation, prostate seed implant or active surveillance.  PLAN:This visit was conducted via video visit to spare the patient unnecessary potential exposure in the  healthcare setting during the current COVID-19 pandemic.  Today I reviewed the findings and workup thus far.  We discussed the natural history of prostate cancer.  We reviewed the the implications of T-stage, Gleason's Score, and PSA on decision-making and outcomes in prostate cancer.  We discussed radiation treatment in the management of prostate cancer with regard to the logistics and delivery of external beam radiation treatment as well as the logistics and delivery of prostate brachytherapy.  We compared and contrasted each of these approaches and also compared these against prostatectomy.    We spent most of our time comparing prostate brachytherapy versus active surveillance.  We talked about the pros and cons of each.  The patient would seemingly be an excellent candidate for either option.  He plans to follow-up with his wife and Dr. Sherrie Sport to discuss before making his final decision.  I enjoyed meeting with him today, and will look forward to participating in the care of this very nice gentleman.   I spent 60 min  on this encounter including preparation work, chart review, documentation and correspondence.    Given current concerns for patient exposure during the COVID-19 pandemic, this encounter was conducted via IT sales professional embedded in [place].  The patient has given verbal consent for this type of encounter. The time spent during this encounter was 60 minutes in total. The attendants for this meeting include Tyler Pita MD, London Pepper, RN, patient, and Ernst Bowler. During the encounter, Tyler Pita MD and London Pepper, RN, were located at [facility] Radiation Oncology Department.  Patient was located at home.  ------------------------------------------------  Sheral Apley. Tammi Klippel, M.D.

## 2021-04-12 ENCOUNTER — Telehealth: Payer: Self-pay

## 2021-04-18 NOTE — Telephone Encounter (Signed)
Called pt. His pain has stopped. He will call back if needed.

## 2021-04-24 ENCOUNTER — Ambulatory Visit: Payer: PPO | Admitting: Radiation Oncology

## 2021-04-24 DIAGNOSIS — Z6830 Body mass index (BMI) 30.0-30.9, adult: Secondary | ICD-10-CM | POA: Diagnosis not present

## 2021-04-24 DIAGNOSIS — E1169 Type 2 diabetes mellitus with other specified complication: Secondary | ICD-10-CM | POA: Diagnosis not present

## 2021-04-24 DIAGNOSIS — I10 Essential (primary) hypertension: Secondary | ICD-10-CM | POA: Diagnosis not present

## 2021-04-24 DIAGNOSIS — E782 Mixed hyperlipidemia: Secondary | ICD-10-CM | POA: Diagnosis not present

## 2021-04-26 DIAGNOSIS — Z13820 Encounter for screening for osteoporosis: Secondary | ICD-10-CM | POA: Diagnosis not present

## 2021-04-26 DIAGNOSIS — M810 Age-related osteoporosis without current pathological fracture: Secondary | ICD-10-CM | POA: Diagnosis not present

## 2021-04-28 DIAGNOSIS — E782 Mixed hyperlipidemia: Secondary | ICD-10-CM | POA: Diagnosis not present

## 2021-04-28 DIAGNOSIS — I10 Essential (primary) hypertension: Secondary | ICD-10-CM | POA: Diagnosis not present

## 2021-04-28 DIAGNOSIS — E1169 Type 2 diabetes mellitus with other specified complication: Secondary | ICD-10-CM | POA: Diagnosis not present

## 2021-05-28 DIAGNOSIS — E782 Mixed hyperlipidemia: Secondary | ICD-10-CM | POA: Diagnosis not present

## 2021-05-28 DIAGNOSIS — E1169 Type 2 diabetes mellitus with other specified complication: Secondary | ICD-10-CM | POA: Diagnosis not present

## 2021-05-28 DIAGNOSIS — I10 Essential (primary) hypertension: Secondary | ICD-10-CM | POA: Diagnosis not present

## 2021-06-25 DIAGNOSIS — H43813 Vitreous degeneration, bilateral: Secondary | ICD-10-CM | POA: Diagnosis not present

## 2021-06-25 DIAGNOSIS — H33331 Multiple defects of retina without detachment, right eye: Secondary | ICD-10-CM | POA: Diagnosis not present

## 2021-06-25 DIAGNOSIS — H35373 Puckering of macula, bilateral: Secondary | ICD-10-CM | POA: Diagnosis not present

## 2021-06-25 DIAGNOSIS — H3589 Other specified retinal disorders: Secondary | ICD-10-CM | POA: Diagnosis not present

## 2021-06-28 DIAGNOSIS — E782 Mixed hyperlipidemia: Secondary | ICD-10-CM | POA: Diagnosis not present

## 2021-06-28 DIAGNOSIS — I10 Essential (primary) hypertension: Secondary | ICD-10-CM | POA: Diagnosis not present

## 2021-06-28 DIAGNOSIS — E1169 Type 2 diabetes mellitus with other specified complication: Secondary | ICD-10-CM | POA: Diagnosis not present

## 2021-07-27 DIAGNOSIS — H35373 Puckering of macula, bilateral: Secondary | ICD-10-CM | POA: Diagnosis not present

## 2021-07-27 DIAGNOSIS — H33331 Multiple defects of retina without detachment, right eye: Secondary | ICD-10-CM | POA: Diagnosis not present

## 2021-07-27 DIAGNOSIS — H31091 Other chorioretinal scars, right eye: Secondary | ICD-10-CM | POA: Diagnosis not present

## 2021-07-27 DIAGNOSIS — H43813 Vitreous degeneration, bilateral: Secondary | ICD-10-CM | POA: Diagnosis not present

## 2021-07-29 DIAGNOSIS — E1169 Type 2 diabetes mellitus with other specified complication: Secondary | ICD-10-CM | POA: Diagnosis not present

## 2021-07-29 DIAGNOSIS — I10 Essential (primary) hypertension: Secondary | ICD-10-CM | POA: Diagnosis not present

## 2021-07-29 DIAGNOSIS — E782 Mixed hyperlipidemia: Secondary | ICD-10-CM | POA: Diagnosis not present

## 2021-07-30 DIAGNOSIS — Z0000 Encounter for general adult medical examination without abnormal findings: Secondary | ICD-10-CM | POA: Diagnosis not present

## 2021-07-30 DIAGNOSIS — Z6829 Body mass index (BMI) 29.0-29.9, adult: Secondary | ICD-10-CM | POA: Diagnosis not present

## 2021-07-30 DIAGNOSIS — I10 Essential (primary) hypertension: Secondary | ICD-10-CM | POA: Diagnosis not present

## 2021-07-30 DIAGNOSIS — E782 Mixed hyperlipidemia: Secondary | ICD-10-CM | POA: Diagnosis not present

## 2021-07-30 DIAGNOSIS — Z125 Encounter for screening for malignant neoplasm of prostate: Secondary | ICD-10-CM | POA: Diagnosis not present

## 2021-07-30 DIAGNOSIS — E1169 Type 2 diabetes mellitus with other specified complication: Secondary | ICD-10-CM | POA: Diagnosis not present

## 2021-07-30 DIAGNOSIS — Z1331 Encounter for screening for depression: Secondary | ICD-10-CM | POA: Diagnosis not present

## 2021-08-21 DIAGNOSIS — H524 Presbyopia: Secondary | ICD-10-CM | POA: Diagnosis not present

## 2021-08-21 DIAGNOSIS — H401121 Primary open-angle glaucoma, left eye, mild stage: Secondary | ICD-10-CM | POA: Diagnosis not present

## 2021-08-29 DIAGNOSIS — E1169 Type 2 diabetes mellitus with other specified complication: Secondary | ICD-10-CM | POA: Diagnosis not present

## 2021-08-29 DIAGNOSIS — I10 Essential (primary) hypertension: Secondary | ICD-10-CM | POA: Diagnosis not present

## 2021-08-29 DIAGNOSIS — E782 Mixed hyperlipidemia: Secondary | ICD-10-CM | POA: Diagnosis not present

## 2021-09-12 DIAGNOSIS — R070 Pain in throat: Secondary | ICD-10-CM | POA: Diagnosis not present

## 2021-09-12 DIAGNOSIS — J029 Acute pharyngitis, unspecified: Secondary | ICD-10-CM | POA: Diagnosis not present

## 2021-09-12 DIAGNOSIS — Z20822 Contact with and (suspected) exposure to covid-19: Secondary | ICD-10-CM | POA: Diagnosis not present

## 2021-10-09 DIAGNOSIS — H43813 Vitreous degeneration, bilateral: Secondary | ICD-10-CM | POA: Diagnosis not present

## 2021-10-09 DIAGNOSIS — H33311 Horseshoe tear of retina without detachment, right eye: Secondary | ICD-10-CM | POA: Diagnosis not present

## 2021-10-09 DIAGNOSIS — H3589 Other specified retinal disorders: Secondary | ICD-10-CM | POA: Diagnosis not present

## 2021-10-09 DIAGNOSIS — H31091 Other chorioretinal scars, right eye: Secondary | ICD-10-CM | POA: Diagnosis not present

## 2021-10-15 ENCOUNTER — Other Ambulatory Visit: Payer: Self-pay

## 2021-10-29 DIAGNOSIS — I10 Essential (primary) hypertension: Secondary | ICD-10-CM | POA: Diagnosis not present

## 2021-10-29 DIAGNOSIS — E782 Mixed hyperlipidemia: Secondary | ICD-10-CM | POA: Diagnosis not present

## 2021-10-29 DIAGNOSIS — E1169 Type 2 diabetes mellitus with other specified complication: Secondary | ICD-10-CM | POA: Diagnosis not present

## 2021-11-05 DIAGNOSIS — E1169 Type 2 diabetes mellitus with other specified complication: Secondary | ICD-10-CM | POA: Diagnosis not present

## 2021-11-05 DIAGNOSIS — E782 Mixed hyperlipidemia: Secondary | ICD-10-CM | POA: Diagnosis not present

## 2021-11-05 DIAGNOSIS — Z0000 Encounter for general adult medical examination without abnormal findings: Secondary | ICD-10-CM | POA: Diagnosis not present

## 2021-11-05 DIAGNOSIS — I10 Essential (primary) hypertension: Secondary | ICD-10-CM | POA: Diagnosis not present

## 2021-11-05 DIAGNOSIS — Z6830 Body mass index (BMI) 30.0-30.9, adult: Secondary | ICD-10-CM | POA: Diagnosis not present

## 2021-12-04 ENCOUNTER — Encounter: Payer: Self-pay | Admitting: Urology

## 2021-12-04 ENCOUNTER — Ambulatory Visit: Payer: PPO | Admitting: Urology

## 2021-12-04 ENCOUNTER — Other Ambulatory Visit: Payer: Self-pay

## 2021-12-04 VITALS — BP 144/88 | HR 66

## 2021-12-04 DIAGNOSIS — C61 Malignant neoplasm of prostate: Secondary | ICD-10-CM | POA: Diagnosis not present

## 2021-12-04 DIAGNOSIS — N400 Enlarged prostate without lower urinary tract symptoms: Secondary | ICD-10-CM | POA: Diagnosis not present

## 2021-12-04 DIAGNOSIS — R972 Elevated prostate specific antigen [PSA]: Secondary | ICD-10-CM

## 2021-12-04 LAB — URINALYSIS, ROUTINE W REFLEX MICROSCOPIC
Bilirubin, UA: NEGATIVE
Glucose, UA: NEGATIVE
Ketones, UA: NEGATIVE
Leukocytes,UA: NEGATIVE
Nitrite, UA: NEGATIVE
Protein,UA: NEGATIVE
RBC, UA: NEGATIVE
Specific Gravity, UA: 1.01 (ref 1.005–1.030)
Urobilinogen, Ur: 0.2 mg/dL (ref 0.2–1.0)
pH, UA: 5 (ref 5.0–7.5)

## 2021-12-04 NOTE — Progress Notes (Signed)
History of Present Illness: Here for follow-up of prostate cancer.    TRUSP/Bx 1.11.2022:   PSA: 4.3 Prostate volume 46.4 mL. PSAD 0.09. Staging: T1c GG: 3+4 (Right Apex) Positive cores/Total cores: 1/12 PSA: 4.3 [facility] nomo:  OCD: 76% EE: 22% LNI: 2% SVI: 1%  I counseled the patient regarding treatment.  I recommended active surveillance due to his very low volume favorable intermediate risk prostate cancer.  12.6.2022: Here for routine follow-up.  He had a PSA checked by his primary care doc in August that was 5.1.  He is not having any significant urinary symptoms.  IPSS 4, quality-of-life score 1.  Past Medical History:  Diagnosis Date   Anxiety state, unspecified    Diabetes mellitus without complication ([place])    Encounter for long-term (current) use of other medications    Gout    Hyperchloremia arthrit   Other and unspecified hyperlipidemia    Other malaise and fatigue    Palpitations    Precordial pain    Prostate cancer (HCC)    Pure hypercholesterolemia    Shortness of breath    Solitary pulmonary nodule    Syncope and collapse    Unspecified essential hypertension     Past Surgical History:  Procedure Laterality Date   PROSTATE BIOPSY     WISDOM TOOTH EXTRACTION      Home Medications:  Allergies as of 12/04/2021   No Known Allergies      Medication List        Accurate as of December 04, 2021  8:18 AM. If you have any questions, ask your nurse or doctor.          atorvastatin 20 MG tablet Commonly known as: LIPITOR Take 20 mg by mouth daily.   diltiazem 60 MG tablet Commonly known as: CARDIZEM TAKE 1 TABLET (60 MG TOTAL) BY MOUTH 2 (TWO) TIMES DAILY.   ferrous sulfate 325 (65 FE) MG tablet Take 325 mg by mouth 2 (two) times daily.   fish oil-omega-3 fatty acids 1000 MG capsule Take 1 g by mouth daily.   lisinopril 10 MG tablet Commonly known as: ZESTRIL Take 10 mg by mouth daily.   OneTouch Verio test strip Generic drug:  glucose blood daily.   Vitamin D3 50 MCG (2000 UT) Tabs Take 2,000 Units by mouth daily.        Allergies: No Known Allergies  Family History  Problem Relation Age of Onset   Heart disease Mother    Cancer Maternal Uncle    Breast cancer Neg Hx    Colon cancer Neg Hx    Pancreatic cancer Neg Hx    Prostate cancer Neg Hx     Social History:  reports that he has never smoked. He has never used smokeless tobacco. He reports current alcohol use. He reports that he does not use drugs.  ROS: A complete review of systems was performed.  All systems are negative except for pertinent findings as noted.  Physical Exam:  Vital signs in last 24 hours: There were no vitals taken for this visit. Constitutional:  Alert and oriented, No acute distress Cardiovascular: Regular rate  Respiratory: Normal respiratory effor Genitourinary: Normal male phallus, testes are descended bilaterally and non-tender and without masses, scrotum is normal in appearance without lesions or masses, perineum is normal on inspection.  Normal anal sphincter tone, prostate 40 mL, symmetric, nonnodular, nontender. Lymphatic: No lymphadenopathy Neurologic: Grossly intact, no focal deficits Psychiatric: Normal mood and affect  I have reviewed  prior pt notes  I have reviewed notes from referring/previous physicians  I have reviewed urinalysis results  I have independently reviewed prior imaging-prostate ultrasound  I have reviewed prior PSA and pathology results     Impression/Assessment:  Very low volume GG 2 prostate cancer.  On active surveillance, initial biopsy in January, 2021.  Doing well.  Plan:  1.  We will check PSA and BUN/creatinine today  2.  I will arrange prostate MRI at [place].  Following that, we will proceed with fusion biopsy or systematic biopsy

## 2021-12-05 LAB — PSA: Prostate Specific Ag, Serum: 5.3 ng/mL — ABNORMAL HIGH (ref 0.0–4.0)

## 2021-12-06 LAB — BUN+CREAT
BUN/Creatinine Ratio: 17 (ref 10–24)
BUN: 18 mg/dL (ref 8–27)
Creatinine, Ser: 1.08 mg/dL (ref 0.76–1.27)
eGFR: 73 mL/min/{1.73_m2} (ref >59)

## 2021-12-06 LAB — SPECIMEN STATUS REPORT

## 2021-12-17 DIAGNOSIS — H401231 Low-tension glaucoma, bilateral, mild stage: Secondary | ICD-10-CM | POA: Diagnosis not present

## 2021-12-28 DIAGNOSIS — E1169 Type 2 diabetes mellitus with other specified complication: Secondary | ICD-10-CM | POA: Diagnosis not present

## 2021-12-28 DIAGNOSIS — E782 Mixed hyperlipidemia: Secondary | ICD-10-CM | POA: Diagnosis not present

## 2021-12-28 DIAGNOSIS — I10 Essential (primary) hypertension: Secondary | ICD-10-CM | POA: Diagnosis not present

## 2022-01-05 ENCOUNTER — Other Ambulatory Visit: Payer: Self-pay

## 2022-01-05 ENCOUNTER — Ambulatory Visit
Admission: RE | Admit: 2022-01-05 | Discharge: 2022-01-05 | Disposition: A | Discharge status: Home / Self Care | Payer: PPO | Source: Ambulatory Visit | Attending: Urology | Admitting: Urology

## 2022-01-05 DIAGNOSIS — N400 Enlarged prostate without lower urinary tract symptoms: Secondary | ICD-10-CM | POA: Diagnosis not present

## 2022-01-05 DIAGNOSIS — C61 Malignant neoplasm of prostate: Secondary | ICD-10-CM | POA: Diagnosis not present

## 2022-01-05 DIAGNOSIS — N402 Nodular prostate without lower urinary tract symptoms: Secondary | ICD-10-CM | POA: Diagnosis not present

## 2022-01-05 DIAGNOSIS — R590 Localized enlarged lymph nodes: Secondary | ICD-10-CM | POA: Diagnosis not present

## 2022-01-05 MED ORDER — GADOBENATE DIMEGLUMINE 529 MG/ML IV SOLN
20.0000 mL | Freq: Once | INTRAVENOUS | Status: AC | PRN
  Administered 2022-01-05: 20 mL via INTRAVENOUS

## 2022-01-08 ENCOUNTER — Telehealth: Payer: Self-pay

## 2022-01-08 NOTE — Telephone Encounter (Signed)
Patient called and notified of results. Voiced understanding and wishes to proceed with biopsy.   Message sent to MD

## 2022-01-08 NOTE — Telephone Encounter (Signed)
-----   Message from Franchot Gallo, MD sent at 01/08/2022  8:39 AM EST ----- Please call patient-MRI shows nothing unusual outside of the prostate.  There is a small area on the right side of the prostate that I think we should biopsy specifically in addition to 12 normal biopsies.  If he wants to proceed with this, we need to do it in [place].  Let me know if he wants to go ahead and I will schedule. ----- Message ----- From: Dorisann Frames, RN Sent: 01/07/2022  10:55 AM EST To: Franchot Gallo, MD  Please review

## 2022-01-13 ENCOUNTER — Other Ambulatory Visit: Payer: Self-pay | Admitting: Cardiology

## 2022-01-15 ENCOUNTER — Other Ambulatory Visit: Payer: Self-pay

## 2022-01-15 DIAGNOSIS — R972 Elevated prostate specific antigen [PSA]: Secondary | ICD-10-CM

## 2022-01-22 ENCOUNTER — Other Ambulatory Visit: Payer: Self-pay | Admitting: Urology

## 2022-01-22 NOTE — Telephone Encounter (Signed)
Referral is in chart. Patient will proceed

## 2022-01-30 ENCOUNTER — Other Ambulatory Visit: Payer: Self-pay | Admitting: Cardiology

## 2022-02-06 DIAGNOSIS — E1169 Type 2 diabetes mellitus with other specified complication: Secondary | ICD-10-CM | POA: Diagnosis not present

## 2022-02-06 DIAGNOSIS — Z0000 Encounter for general adult medical examination without abnormal findings: Secondary | ICD-10-CM | POA: Diagnosis not present

## 2022-02-06 DIAGNOSIS — I10 Essential (primary) hypertension: Secondary | ICD-10-CM | POA: Diagnosis not present

## 2022-02-06 DIAGNOSIS — E782 Mixed hyperlipidemia: Secondary | ICD-10-CM | POA: Diagnosis not present

## 2022-02-06 DIAGNOSIS — Z6830 Body mass index (BMI) 30.0-30.9, adult: Secondary | ICD-10-CM | POA: Diagnosis not present

## 2022-02-13 DIAGNOSIS — D075 Carcinoma in situ of prostate: Secondary | ICD-10-CM | POA: Diagnosis not present

## 2022-02-13 DIAGNOSIS — C61 Malignant neoplasm of prostate: Secondary | ICD-10-CM | POA: Diagnosis not present

## 2022-02-13 DIAGNOSIS — R972 Elevated prostate specific antigen [PSA]: Secondary | ICD-10-CM | POA: Diagnosis not present

## 2022-02-19 DIAGNOSIS — H5711 Ocular pain, right eye: Secondary | ICD-10-CM | POA: Diagnosis not present

## 2022-02-19 DIAGNOSIS — H02412 Mechanical ptosis of left eyelid: Secondary | ICD-10-CM | POA: Diagnosis not present

## 2022-02-19 DIAGNOSIS — H2513 Age-related nuclear cataract, bilateral: Secondary | ICD-10-CM | POA: Diagnosis not present

## 2022-02-19 DIAGNOSIS — H02402 Unspecified ptosis of left eyelid: Secondary | ICD-10-CM | POA: Diagnosis not present

## 2022-02-19 DIAGNOSIS — H40013 Open angle with borderline findings, low risk, bilateral: Secondary | ICD-10-CM | POA: Diagnosis not present

## 2022-02-20 DIAGNOSIS — I10 Essential (primary) hypertension: Secondary | ICD-10-CM | POA: Diagnosis not present

## 2022-02-20 DIAGNOSIS — M310 Hypersensitivity angiitis: Secondary | ICD-10-CM | POA: Diagnosis not present

## 2022-02-20 DIAGNOSIS — Z6830 Body mass index (BMI) 30.0-30.9, adult: Secondary | ICD-10-CM | POA: Diagnosis not present

## 2022-02-20 DIAGNOSIS — E1169 Type 2 diabetes mellitus with other specified complication: Secondary | ICD-10-CM | POA: Diagnosis not present

## 2022-02-20 DIAGNOSIS — E782 Mixed hyperlipidemia: Secondary | ICD-10-CM | POA: Diagnosis not present

## 2022-02-28 DIAGNOSIS — H401231 Low-tension glaucoma, bilateral, mild stage: Secondary | ICD-10-CM | POA: Diagnosis not present

## 2022-04-02 ENCOUNTER — Encounter: Payer: Self-pay | Admitting: Urology

## 2022-04-02 ENCOUNTER — Ambulatory Visit: Payer: PPO | Admitting: Urology

## 2022-04-02 VITALS — BP 125/79 | HR 73

## 2022-04-02 DIAGNOSIS — C61 Malignant neoplasm of prostate: Secondary | ICD-10-CM

## 2022-04-02 DIAGNOSIS — N400 Enlarged prostate without lower urinary tract symptoms: Secondary | ICD-10-CM

## 2022-04-02 NOTE — Progress Notes (Signed)
History of Present Illness:  ? ?TRUSP/Bx 1.11.2022: ?  ?PSA: 4.3 ?Prostate volume 46.4 mL. PSAD 0.09. ?Staging: T1c ?GS: 3+4 (Right Apex) ?Positive cores/Total cores: 1/12 ?PSA: 4.3 ?[facility]KCC nomo:  ?OCD: 76% ?EE: 22% ?LNI: 2% ?SVI: 1% ? ?I counseled the patient regarding treatment.  I recommended active surveillance due to his very low volume favorable intermediate risk prostate cancer. ? ?12.6.2022: Here for routine follow-up.  He had a PSA checked by his primary care doc in August that was 5.1.  He is not having any significant urinary symptoms.  IPSS 4, quality-of-life score 1. ? ?1.7.2023: MRI prostate. ?1. Small PI-RADS category 4 lesion of the right posteromedial ?peripheral zone at the apex. ?2. No other lesions of intermediate or higher suspicion for ?malignancy are identified. ?3. Mild benign prostatic hypertrophy. Upper normal prostate gland ?size. ? ?2.15.2023: Fusion TRUS/Bx in [place] @ [facility]. ?All 4 cores from region of interest were negative-benign fibromuscular tissue ?1/12 cores from systematic biopsy revealed GS 3+3 pattern adenocarcinoma in right apex medial-10% of core.  Small amount of atypical cells seen left base lateral, high-grade PIN seen in right base. ? ?4.4.2023: Here for routine check/discussion.  No real issues since his biopsy. ? ?Past Medical History:  ?Diagnosis Date  ? Anxiety state, unspecified   ? Diabetes mellitus without complication ([place])   ? Encounter for long-term (current) use of other medications   ? Gout   ? Hyperchloremia arthrit  ? Other and unspecified hyperlipidemia   ? Other malaise and fatigue   ? Palpitations   ? Precordial pain   ? Prostate cancer ([place])   ? Pure hypercholesterolemia   ? Shortness of breath   ? Solitary pulmonary nodule   ? Syncope and collapse   ? Unspecified essential hypertension   ? ? ?Past Surgical History:  ?Procedure Laterality Date  ? PROSTATE BIOPSY    ? WISDOM TOOTH EXTRACTION    ? ? ?Home Medications:  ?Allergies as of 04/02/2022   ?No Known  Allergies ?  ? ?  ?Medication List  ?  ? ?  ? Accurate as of April 02, 2022 12:48 PM. If you have any questions, ask your nurse or doctor.  ?  ?  ? ?  ? ?atorvastatin 20 MG tablet ?Commonly known as: LIPITOR ?Take 20 mg by mouth daily. ?  ?diltiazem 60 MG tablet ?Commonly known as: CARDIZEM ?TAKE 1 TABLET BY MOUTH TWICE A DAY ?  ?ferrous sulfate 325 (65 FE) MG tablet ?Take 325 mg by mouth 2 (two) times daily. ?  ?fish oil-omega-3 fatty acids 1000 MG capsule ?Take 1 g by mouth daily. ?  ?lisinopril 10 MG tablet ?Commonly known as: ZESTRIL ?Take 10 mg by mouth daily. ?  ?OneTouch Verio test strip ?Generic drug: glucose blood ?daily. ?  ?Vitamin D3 50 MCG (2000 UT) Tabs ?Take 2,000 Units by mouth daily. ?  ? ?  ? ? ?Allergies: No Known Allergies ? ?Family History  ?Problem Relation Age of Onset  ? Heart disease Mother   ? Cancer Maternal Uncle   ? Breast cancer Neg Hx   ? Colon cancer Neg Hx   ? Pancreatic cancer Neg Hx   ? Prostate cancer Neg Hx   ? ? ?Social History:  reports that he has never smoked. He has never used smokeless tobacco. He reports current alcohol use. He reports that he does not use drugs. ? ?ROS: ?A complete review of systems was performed.  All systems are negative except for pertinent  findings as noted. ? ?Physical Exam:  ?Vital signs in last 24 hours: ?There were no vitals taken for this visit. ?Constitutional:  Alert and oriented, No acute distress ?Cardiovascular: Regular rate  ?Respiratory: Normal respiratory effort ?Neurologic: Grossly intact, no focal deficits ?Psychiatric: Normal mood and affect ? ?I have reviewed prior pt notes ? ?I have reviewed urinalysis results ? ?I have independently reviewed prior imaging-MRI, ultrasound readings ? ?I have reviewed prior PSA/prior pathology results ? ? ?Impression/Assessment:  ?Very low volume favorable intermediate risk prostate cancer, with confirmatory biopsy actually showing lower risk prostate cancer 2 months ago.  He is doing well.  He is fine  with active surveillance ? ?Plan:  ?I discussed further active surveillance with him-perhaps repeating MRI with fusion biopsy in 2 years ? ?I will see back in 6 months following PSA ? ?

## 2022-04-18 ENCOUNTER — Telehealth: Payer: Self-pay | Admitting: Cardiology

## 2022-04-18 NOTE — Telephone Encounter (Signed)
Call placed to patient - not seen since 2020 & f/u was prn.  Advised him to request refill from his pcp.  He verbalized understanding.   ?

## 2022-04-18 NOTE — Telephone Encounter (Signed)
Pt c/o medication issue: ? ?1. Name of Medication: diltiazem (CARDIZEM) 60 MG tablet ? ?2. How are you currently taking this medication (dosage and times per day)? TAKE 1 TABLET BY MOUTH TWICE A DAY ? ?3. Are you having a reaction (difficulty breathing--STAT)? No  ? ?4. What is your medication issue? Patient is out of medication and need a new 180 tablet prescription of diltiazem (CARDIZEM) 60 MG tablet ?

## 2022-04-30 DIAGNOSIS — H401231 Low-tension glaucoma, bilateral, mild stage: Secondary | ICD-10-CM | POA: Diagnosis not present

## 2022-04-30 DIAGNOSIS — H2513 Age-related nuclear cataract, bilateral: Secondary | ICD-10-CM | POA: Diagnosis not present

## 2022-05-08 DIAGNOSIS — I10 Essential (primary) hypertension: Secondary | ICD-10-CM | POA: Diagnosis not present

## 2022-05-08 DIAGNOSIS — E782 Mixed hyperlipidemia: Secondary | ICD-10-CM | POA: Diagnosis not present

## 2022-05-08 DIAGNOSIS — Z6830 Body mass index (BMI) 30.0-30.9, adult: Secondary | ICD-10-CM | POA: Diagnosis not present

## 2022-05-08 DIAGNOSIS — Z0000 Encounter for general adult medical examination without abnormal findings: Secondary | ICD-10-CM | POA: Diagnosis not present

## 2022-07-16 DIAGNOSIS — H401211 Low-tension glaucoma, right eye, mild stage: Secondary | ICD-10-CM | POA: Diagnosis not present

## 2022-07-16 DIAGNOSIS — H524 Presbyopia: Secondary | ICD-10-CM | POA: Diagnosis not present

## 2022-08-13 DIAGNOSIS — E782 Mixed hyperlipidemia: Secondary | ICD-10-CM | POA: Diagnosis not present

## 2022-08-13 DIAGNOSIS — I10 Essential (primary) hypertension: Secondary | ICD-10-CM | POA: Diagnosis not present

## 2022-08-13 DIAGNOSIS — Z0000 Encounter for general adult medical examination without abnormal findings: Secondary | ICD-10-CM | POA: Diagnosis not present

## 2022-08-13 DIAGNOSIS — Z125 Encounter for screening for malignant neoplasm of prostate: Secondary | ICD-10-CM | POA: Diagnosis not present

## 2022-08-13 DIAGNOSIS — Z6829 Body mass index (BMI) 29.0-29.9, adult: Secondary | ICD-10-CM | POA: Diagnosis not present

## 2022-08-13 DIAGNOSIS — R7303 Prediabetes: Secondary | ICD-10-CM | POA: Diagnosis not present

## 2022-09-16 DIAGNOSIS — I739 Peripheral vascular disease, unspecified: Secondary | ICD-10-CM | POA: Diagnosis not present

## 2022-10-07 NOTE — Progress Notes (Signed)
History of Present Illness:   TRUSP/Bx 1.11.2022:   PSA: 4.3 Prostate volume 46.4 mL. PSAD 0.09. Staging: T1c GS: 3+4 (Right Apex) Positive cores/Total cores: 1/12  OCD: 76% EE: 22% LNI: 2% SVI: 1%  I counseled the patient regarding treatment.  I recommended active surveillance due to his very low volume favorable intermediate risk prostate cancer.  12.6.2022: PSA in August that was 5.1.  He is not having any significant urinary symptoms.  IPSS 4, quality-of-life score 1.   1.7.2023: MRI prostate. 1. Small PI-RADS category 4 lesion of the right posteromedial peripheral zone at the apex. 2. No other lesions of intermediate or higher suspicion for malignancy are identified. 3. Mild benign prostatic hypertrophy. Upper normal prostate gland size.   2.15.2023: Fusion TRUS/Bx in [place] @ [facility]. All 4 cores from region of interest were negative-benign fibromuscular tissue 1/12 cores from systematic biopsy revealed GS 3+3 pattern adenocarcinoma in right apex medial-10% of core.  Small amount of atypical cells seen left base lateral, high-grade PIN seen in right base.  10.10.2023:  Past Medical History:  Diagnosis Date   Anxiety state, unspecified    Diabetes mellitus without complication ([place])    Encounter for long-term (current) use of other medications    Gout    Hyperchloremia arthrit   Other and unspecified hyperlipidemia    Other malaise and fatigue    Palpitations    Precordial pain    Prostate cancer (HCC)    Pure hypercholesterolemia    Shortness of breath    Solitary pulmonary nodule    Syncope and collapse    Unspecified essential hypertension     Past Surgical History:  Procedure Laterality Date   PROSTATE BIOPSY     WISDOM TOOTH EXTRACTION      Home Medications:  Allergies as of 10/08/2022   No Known Allergies      Medication List        Accurate as of October 07, 2022 12:16 PM. If you have any questions, ask your nurse or doctor.           atorvastatin 20 MG tablet Commonly known as: LIPITOR Take 20 mg by mouth daily.   diltiazem 60 MG tablet Commonly known as: CARDIZEM TAKE 1 TABLET BY MOUTH TWICE A DAY   ferrous sulfate 325 (65 FE) MG tablet Take 325 mg by mouth 2 (two) times daily.   fish oil-omega-3 fatty acids 1000 MG capsule Take 1 g by mouth daily.   lisinopril 10 MG tablet Commonly known as: ZESTRIL Take 10 mg by mouth daily.   OneTouch Verio test strip Generic drug: glucose blood daily.   Vitamin D3 50 MCG (2000 UT) Tabs Take 2,000 Units by mouth daily.        Allergies: No Known Allergies  Family History  Problem Relation Age of Onset   Heart disease Mother    Cancer Maternal Uncle    Breast cancer Neg Hx    Colon cancer Neg Hx    Pancreatic cancer Neg Hx    Prostate cancer Neg Hx     Social History:  reports that he has never smoked. He has never used smokeless tobacco. He reports current alcohol use. He reports that he does not use drugs.  ROS: A complete review of systems was performed.  All systems are negative except for pertinent findings as noted.  Physical Exam:  Vital signs in last 24 hours: There were no vitals taken for this visit. Constitutional:  Alert and oriented, No  acute distress Cardiovascular: Regular rate  Respiratory: Normal respiratory effort GI: Abdomen is soft, nontender, nondistended, no abdominal masses. No CVAT.  Genitourinary: Normal male phallus, testes are descended bilaterally and non-tender and without masses, scrotum is normal in appearance without lesions or masses, perineum is normal on inspection. Lymphatic: No lymphadenopathy Neurologic: Grossly intact, no focal deficits Psychiatric: Normal mood and affect  I have reviewed prior pt notes  I have reviewed notes from referring/previous physicians  I have reviewed urinalysis results  I have independently reviewed prior imaging  I have reviewed prior PSA results  I have reviewed prior  urine culture   Impression/Assessment:  ***  Plan:  ***

## 2022-10-08 ENCOUNTER — Ambulatory Visit (INDEPENDENT_AMBULATORY_CARE_PROVIDER_SITE_OTHER): Payer: PPO | Admitting: Urology

## 2022-10-08 ENCOUNTER — Encounter: Payer: Self-pay | Admitting: Urology

## 2022-10-08 VITALS — BP 145/89 | HR 82 | Ht 71.0 in | Wt 196.0 lb

## 2022-10-08 DIAGNOSIS — C61 Malignant neoplasm of prostate: Secondary | ICD-10-CM | POA: Diagnosis not present

## 2022-10-08 DIAGNOSIS — R972 Elevated prostate specific antigen [PSA]: Secondary | ICD-10-CM | POA: Diagnosis not present

## 2022-10-08 DIAGNOSIS — N5201 Erectile dysfunction due to arterial insufficiency: Secondary | ICD-10-CM | POA: Diagnosis not present

## 2022-10-08 DIAGNOSIS — N400 Enlarged prostate without lower urinary tract symptoms: Secondary | ICD-10-CM | POA: Diagnosis not present

## 2022-10-08 MED ORDER — SILDENAFIL CITRATE 100 MG PO TABS: 100.0000 mg | ORAL_TABLET | ORAL | 11 refills | Status: AC | PRN

## 2022-11-11 DIAGNOSIS — E782 Mixed hyperlipidemia: Secondary | ICD-10-CM | POA: Diagnosis not present

## 2022-11-11 DIAGNOSIS — Z0000 Encounter for general adult medical examination without abnormal findings: Secondary | ICD-10-CM | POA: Diagnosis not present

## 2022-11-11 DIAGNOSIS — I10 Essential (primary) hypertension: Secondary | ICD-10-CM | POA: Diagnosis not present

## 2022-11-11 DIAGNOSIS — Z6830 Body mass index (BMI) 30.0-30.9, adult: Secondary | ICD-10-CM | POA: Diagnosis not present

## 2023-01-21 DIAGNOSIS — H401231 Low-tension glaucoma, bilateral, mild stage: Secondary | ICD-10-CM | POA: Diagnosis not present

## 2023-02-12 DIAGNOSIS — Z0000 Encounter for general adult medical examination without abnormal findings: Secondary | ICD-10-CM | POA: Diagnosis not present

## 2023-02-12 DIAGNOSIS — I10 Essential (primary) hypertension: Secondary | ICD-10-CM | POA: Diagnosis not present

## 2023-02-12 DIAGNOSIS — Z6830 Body mass index (BMI) 30.0-30.9, adult: Secondary | ICD-10-CM | POA: Diagnosis not present

## 2023-02-12 DIAGNOSIS — E782 Mixed hyperlipidemia: Secondary | ICD-10-CM | POA: Diagnosis not present

## 2023-02-12 DIAGNOSIS — R739 Hyperglycemia, unspecified: Secondary | ICD-10-CM | POA: Diagnosis not present

## 2023-03-10 NOTE — Progress Notes (Signed)
History of Present Illness:   TRUSP/Bx 1.11.2022:   PSA: 4.3 Prostate volume 46.4 mL. PSAD 0.09. Staging: T1c GS: 3+4 (Right Apex) Positive cores/Total cores: 1/12   OCD: 76% EE: 22% LNI: 2% SVI: 1%  I counseled the patient regarding treatment.  I recommended active surveillance due to his very low volume favorable intermediate risk prostate cancer.  12.6.2022: PSA  5.3.  IPSS 4, quality-of-life score 1.   1.7.2023: MRI prostate. 1. Small PI-RADS category 4 lesion of the right posteromedial peripheral zone at the apex. 2. No other lesions of intermediate or higher suspicion for malignancy are identified. 3. Mild benign prostatic hypertrophy. Upper normal prostate gland size.   2.15.2023: Fusion TRUS/Bx in [place] @ [facility]. All 4 cores from region of interest were negative-benign fibromuscular tissue 1/12 cores from systematic biopsy revealed GS 3+3 pattern adenocarcinoma in right apex medial-10% of core.  Small amount of atypical cells seen left base lateral, high-grade PIN seen in right base  8.16.2023: PSA 7.9  3.12.2024:   Past Medical History:  Diagnosis Date   Anxiety state, unspecified    Diabetes mellitus without complication ([place])    Encounter for long-term (current) use of other medications    Gout    Hyperchloremia arthrit   Other and unspecified hyperlipidemia    Other malaise and fatigue    Palpitations    Precordial pain    Prostate cancer (HCC)    Pure hypercholesterolemia    Shortness of breath    Solitary pulmonary nodule    Syncope and collapse    Unspecified essential hypertension     Past Surgical History:  Procedure Laterality Date   PROSTATE BIOPSY     WISDOM TOOTH EXTRACTION      Home Medications:  Allergies as of 03/11/2023   No Known Allergies      Medication List        Accurate as of March 10, 2023 10:10 AM. If you have any questions, ask your nurse or doctor.          diltiazem 60 MG tablet Commonly known as:  CARDIZEM TAKE 1 TABLET BY MOUTH TWICE A DAY   ferrous sulfate 325 (65 FE) MG tablet Take 325 mg by mouth 2 (two) times daily.   fish oil-omega-3 fatty acids 1000 MG capsule Take 1 g by mouth daily.   lisinopril 10 MG tablet Commonly known as: ZESTRIL Take 10 mg by mouth daily.   OneTouch Verio test strip Generic drug: glucose blood daily.   pravastatin 80 MG tablet Commonly known as: PRAVACHOL Take 80 mg by mouth at bedtime.   sildenafil 100 MG tablet Commonly known as: VIAGRA Take 1 tablet (100 mg total) by mouth as needed for erectile dysfunction.   Vitamin D3 50 MCG (2000 UT) Tabs Generic drug: Cholecalciferol Take 2,000 Units by mouth daily.        Allergies: No Known Allergies  Family History  Problem Relation Age of Onset   Heart disease Mother    Cancer Maternal Uncle    Breast cancer Neg Hx    Colon cancer Neg Hx    Pancreatic cancer Neg Hx    Prostate cancer Neg Hx     Social History:  reports that he has never smoked. He has never used smokeless tobacco. He reports current alcohol use. He reports that he does not use drugs.  ROS: A complete review of systems was performed.  All systems are negative except for pertinent findings as noted.  Physical Exam:  Vital signs in last 24 hours: There were no vitals taken for this visit. Constitutional:  Alert and oriented, No acute distress Cardiovascular: Regular rate  Respiratory: Normal respiratory effort GI: Abdomen is soft, nontender, nondistended, no abdominal masses. No CVAT.  Genitourinary: Normal male phallus, testes are descended bilaterally and non-tender and without masses, scrotum is normal in appearance without lesions or masses, perineum is normal on inspection. Lymphatic: No lymphadenopathy Neurologic: Grossly intact, no focal deficits Psychiatric: Normal mood and affect  I have reviewed prior pt notes  I have reviewed notes from referring/previous physicians  I have reviewed urinalysis  results  I have independently reviewed prior imaging  I have reviewed prior PSA results  I have reviewed prior urine culture   Impression/Assessment:  ***  Plan:  ***

## 2023-03-11 ENCOUNTER — Encounter: Payer: Self-pay | Admitting: Urology

## 2023-03-11 ENCOUNTER — Ambulatory Visit (INDEPENDENT_AMBULATORY_CARE_PROVIDER_SITE_OTHER): Payer: PPO | Admitting: Urology

## 2023-03-11 VITALS — BP 132/84 | HR 61

## 2023-03-11 DIAGNOSIS — N5201 Erectile dysfunction due to arterial insufficiency: Secondary | ICD-10-CM

## 2023-03-11 DIAGNOSIS — C61 Malignant neoplasm of prostate: Secondary | ICD-10-CM

## 2023-03-11 DIAGNOSIS — N400 Enlarged prostate without lower urinary tract symptoms: Secondary | ICD-10-CM | POA: Diagnosis not present

## 2023-03-11 LAB — URINALYSIS, ROUTINE W REFLEX MICROSCOPIC
Bilirubin, UA: NEGATIVE
Glucose, UA: NEGATIVE
Ketones, UA: NEGATIVE
Leukocytes,UA: NEGATIVE
Nitrite, UA: NEGATIVE
Protein,UA: NEGATIVE
RBC, UA: NEGATIVE
Specific Gravity, UA: <1.005 — ABNORMAL LOW (ref 1.005–1.030)
Urobilinogen, Ur: 0.2 mg/dL (ref 0.2–1.0)
pH, UA: 5 (ref 5.0–7.5)

## 2023-03-12 LAB — URINALYSIS, ROUTINE W REFLEX MICROSCOPIC
Bilirubin, UA: NEGATIVE
Glucose, UA: NEGATIVE
Ketones, UA: NEGATIVE
Leukocytes,UA: NEGATIVE
Nitrite, UA: NEGATIVE
Protein,UA: NEGATIVE
RBC, UA: NEGATIVE
Specific Gravity, UA: 1.005 (ref 1.005–1.030)
Urobilinogen, Ur: 0.2 mg/dL (ref 0.2–1.0)
pH, UA: 6 (ref 5.0–7.5)

## 2023-03-12 LAB — PSA: Prostate Specific Ag, Serum: 6.5 ng/mL — ABNORMAL HIGH (ref 0.0–4.0)

## 2023-05-06 ENCOUNTER — Ambulatory Visit: Payer: PPO | Admitting: Urology

## 2023-05-14 DIAGNOSIS — R7303 Prediabetes: Secondary | ICD-10-CM | POA: Diagnosis not present

## 2023-05-14 DIAGNOSIS — D075 Carcinoma in situ of prostate: Secondary | ICD-10-CM | POA: Diagnosis not present

## 2023-05-14 DIAGNOSIS — E7849 Other hyperlipidemia: Secondary | ICD-10-CM | POA: Diagnosis not present

## 2023-05-14 DIAGNOSIS — I10 Essential (primary) hypertension: Secondary | ICD-10-CM | POA: Diagnosis not present

## 2023-05-14 DIAGNOSIS — Z6830 Body mass index (BMI) 30.0-30.9, adult: Secondary | ICD-10-CM | POA: Diagnosis not present

## 2023-07-17 DIAGNOSIS — Z1159 Encounter for screening for other viral diseases: Secondary | ICD-10-CM | POA: Diagnosis not present

## 2023-07-17 DIAGNOSIS — R7303 Prediabetes: Secondary | ICD-10-CM | POA: Diagnosis not present

## 2023-07-17 DIAGNOSIS — Z6830 Body mass index (BMI) 30.0-30.9, adult: Secondary | ICD-10-CM | POA: Diagnosis not present

## 2023-07-17 DIAGNOSIS — J0110 Acute frontal sinusitis, unspecified: Secondary | ICD-10-CM | POA: Diagnosis not present

## 2023-07-17 DIAGNOSIS — E782 Mixed hyperlipidemia: Secondary | ICD-10-CM | POA: Diagnosis not present

## 2023-07-17 DIAGNOSIS — Z0000 Encounter for general adult medical examination without abnormal findings: Secondary | ICD-10-CM | POA: Diagnosis not present

## 2023-07-17 DIAGNOSIS — I10 Essential (primary) hypertension: Secondary | ICD-10-CM | POA: Diagnosis not present

## 2023-07-17 DIAGNOSIS — I480 Paroxysmal atrial fibrillation: Secondary | ICD-10-CM | POA: Diagnosis not present

## 2023-07-17 DIAGNOSIS — D075 Carcinoma in situ of prostate: Secondary | ICD-10-CM | POA: Diagnosis not present

## 2023-08-05 DIAGNOSIS — H401231 Low-tension glaucoma, bilateral, mild stage: Secondary | ICD-10-CM | POA: Diagnosis not present

## 2023-08-05 DIAGNOSIS — H524 Presbyopia: Secondary | ICD-10-CM | POA: Diagnosis not present

## 2023-08-29 DIAGNOSIS — N481 Balanitis: Secondary | ICD-10-CM | POA: Diagnosis not present

## 2023-09-08 NOTE — Progress Notes (Signed)
History of Present Illness: Here for continued f/u of PCa--on AS.  TRUSP/Bx 1.11.2022:   PSA: 4.3 Prostate volume 46.4 mL. PSAD 0.09. Staging: T1c GS: 3+4 (Right Apex) Positive cores/Total cores: 1/12   OCD: 76% EE: 22% LNI: 2% SVI: 1%  Pt decided on AS  12.6.2022: PSA  5.3.    1.7.2023: MRI prostate. 1. Small PI-RADS category 4 lesion of the right posteromedial peripheral zone at the apex. 2. No other lesions of intermediate or higher suspicion for malignancy are identified. 3. Mild benign prostatic hypertrophy. Upper normal prostate gland size.   2.15.2023: Fusion TRUS/Bx in [place] @ [facility]. All 4 cores from region of interest were negative-benign fibromuscular tissue 1/12 cores from systematic biopsy revealed GS 3+3 pattern adenocarcinoma in right apex medial-10% of core.  Small amount of atypical cells seen left base lateral, high-grade PIN seen in right base  8.16.2023: PSA 7.9 3.12.2024: PSA 6.5  9.10.2024:  Past Medical History:  Diagnosis Date   Anxiety state, unspecified    Diabetes mellitus without complication (HCC)    Encounter for long-term (current) use of other medications    Gout    Hyperchloremia arthrit   Other and unspecified hyperlipidemia    Other malaise and fatigue    Palpitations    Precordial pain    Prostate cancer (HCC)    Pure hypercholesterolemia    Shortness of breath    Solitary pulmonary nodule    Syncope and collapse    Unspecified essential hypertension     Past Surgical History:  Procedure Laterality Date   PROSTATE BIOPSY     WISDOM TOOTH EXTRACTION      Home Medications:  Allergies as of 09/09/2023   No Known Allergies      Medication List        Accurate as of September 08, 2023  3:56 PM. If you have any questions, ask your nurse or doctor.          diltiazem 60 MG tablet Commonly known as: CARDIZEM TAKE 1 TABLET BY MOUTH TWICE A DAY   ferrous sulfate 325 (65 FE) MG tablet Take 325 mg by mouth 2 (two)  times daily.   fish oil-omega-3 fatty acids 1000 MG capsule Take 1 g by mouth daily.   lisinopril 10 MG tablet Commonly known as: ZESTRIL Take 10 mg by mouth daily.   OneTouch Verio test strip Generic drug: glucose blood daily.   pravastatin 80 MG tablet Commonly known as: PRAVACHOL Take 80 mg by mouth at bedtime.   sildenafil 100 MG tablet Commonly known as: VIAGRA Take 1 tablet (100 mg total) by mouth as needed for erectile dysfunction.   Vitamin D3 50 MCG (2000 UT) Tabs Take 2,000 Units by mouth daily.        Allergies: No Known Allergies  Family History  Problem Relation Age of Onset   Heart disease Mother    Cancer Maternal Uncle    Breast cancer Neg Hx    Colon cancer Neg Hx    Pancreatic cancer Neg Hx    Prostate cancer Neg Hx     Social History:  reports that he has never smoked. He has never used smokeless tobacco. He reports current alcohol use. He reports that he does not use drugs.  ROS: A complete review of systems was performed.  All systems are negative except for pertinent findings as noted.  Physical Exam:  Vital signs in last 24 hours: There were no vitals taken for this visit. Constitutional:  Alert and oriented, No acute distress Cardiovascular: Regular rate  Respiratory: Normal respiratory effort GI: Abdomen is soft, nontender, nondistended, no abdominal masses. No CVAT.  Genitourinary: Normal male phallus, testes are descended bilaterally and non-tender and without masses, scrotum is normal in appearance without lesions or masses, perineum is normal on inspection. Lymphatic: No lymphadenopathy Neurologic: Grossly intact, no focal deficits Psychiatric: Normal mood and affect  I have reviewed prior pt notes  I have reviewed notes from referring/previous physicians  I have reviewed urinalysis results  I have independently reviewed prior imaging  I have reviewed prior PSA results  I have reviewed prior urine  culture   Impression/Assessment:  ***  Plan:  *** History of Present Illness:   TRUSP/Bx 1.11.2022:   PSA: 4.3 Prostate volume 46.4 mL. PSAD 0.09. Staging: T1c GS: 3+4 (Right Apex) Positive cores/Total cores: 1/12  OCD: 76% EE: 22% LNI: 2% SVI: 1%  I counseled the patient regarding treatment.  I recommended active surveillance due to his very low volume favorable intermediate risk prostate cancer.  12.6.2022: PSA  5.3.  IPSS 4, quality-of-life score 1.   1.7.2023: MRI prostate. 1. Small PI-RADS category 4 lesion of the right posteromedial peripheral zone at the apex. 2. No other lesions of intermediate or higher suspicion for malignancy are identified. 3. Mild benign prostatic hypertrophy. Upper normal prostate gland size.   2.15.2023: Fusion TRUS/Bx in [place] @ [facility]. All 4 cores from region of interest were negative-benign fibromuscular tissue 1/12 cores from systematic biopsy revealed GS 3+3 pattern adenocarcinoma in right apex medial-10% of core.  Small amount of atypical cells seen left base lateral, high-grade PIN seen in right base.  10.10.2023: Here today for routine check.  He has had no urinary symptoms.  He has had no blood in his urine.  IPSS 2, quality-of-life score 0.  He does not know if he has had a recent PSA by his PCP.  Past Medical History:  Diagnosis Date   Anxiety state, unspecified    Diabetes mellitus without complication (HCC)    Encounter for long-term (current) use of other medications    Gout    Hyperchloremia arthrit   Other and unspecified hyperlipidemia    Other malaise and fatigue    Palpitations    Precordial pain    Prostate cancer (HCC)    Pure hypercholesterolemia    Shortness of breath    Solitary pulmonary nodule    Syncope and collapse    Unspecified essential hypertension     Past Surgical History:  Procedure Laterality Date   PROSTATE BIOPSY     WISDOM TOOTH EXTRACTION      Home Medications:  Allergies as of  09/09/2023   No Known Allergies      Medication List        Accurate as of September 08, 2023  3:56 PM. If you have any questions, ask your nurse or doctor.          diltiazem 60 MG tablet Commonly known as: CARDIZEM TAKE 1 TABLET BY MOUTH TWICE A DAY   ferrous sulfate 325 (65 FE) MG tablet Take 325 mg by mouth 2 (two) times daily.   fish oil-omega-3 fatty acids 1000 MG capsule Take 1 g by mouth daily.   lisinopril 10 MG tablet Commonly known as: ZESTRIL Take 10 mg by mouth daily.   OneTouch Verio test strip Generic drug: glucose blood daily.   pravastatin 80 MG tablet Commonly known as: PRAVACHOL Take 80 mg by mouth at  bedtime.   sildenafil 100 MG tablet Commonly known as: VIAGRA Take 1 tablet (100 mg total) by mouth as needed for erectile dysfunction.   Vitamin D3 50 MCG (2000 UT) Tabs Take 2,000 Units by mouth daily.        Allergies: No Known Allergies  Family History  Problem Relation Age of Onset   Heart disease Mother    Cancer Maternal Uncle    Breast cancer Neg Hx    Colon cancer Neg Hx    Pancreatic cancer Neg Hx    Prostate cancer Neg Hx     Social History:  reports that he has never smoked. He has never used smokeless tobacco. He reports current alcohol use. He reports that he does not use drugs.  ROS: A complete review of systems was performed.  All systems are negative except for pertinent findings as noted.  Physical Exam:  Vital signs in last 24 hours: There were no vitals taken for this visit. Constitutional:  Alert and oriented, No acute distress Cardiovascular: Regular rate  Respiratory: Normal respiratory effort Psychiatric: Normal mood and affect  I have reviewed prior pt notes  I have reviewed urinalysis results  I have independently reviewed prior imaging  I have reviewed prior PSA and pathology results  MRI results reviewed    Impression/Assessment:  1.  GG 2 adenocarcinoma, first diagnosed in 2022.  1 out of  12 cores positive for small amount of GS 3+4 adenocarcinoma.  Fusion biopsy from earlier this year revealed 1/16 cores with GG 1 1 adenocarcinoma.    2.  ED--untreated  Plan:

## 2023-09-09 ENCOUNTER — Ambulatory Visit: Payer: PPO | Admitting: Urology

## 2023-09-09 ENCOUNTER — Encounter: Payer: Self-pay | Admitting: Urology

## 2023-09-09 VITALS — BP 136/80 | HR 59

## 2023-09-09 DIAGNOSIS — C61 Malignant neoplasm of prostate: Secondary | ICD-10-CM | POA: Diagnosis not present

## 2023-09-09 DIAGNOSIS — N400 Enlarged prostate without lower urinary tract symptoms: Secondary | ICD-10-CM | POA: Diagnosis not present

## 2023-09-09 LAB — URINALYSIS, ROUTINE W REFLEX MICROSCOPIC
Bilirubin, UA: NEGATIVE
Glucose, UA: NEGATIVE
Ketones, UA: NEGATIVE
Leukocytes,UA: NEGATIVE
Nitrite, UA: NEGATIVE
Protein,UA: NEGATIVE
RBC, UA: NEGATIVE
Specific Gravity, UA: 1.01 (ref 1.005–1.030)
Urobilinogen, Ur: 0.2 mg/dL (ref 0.2–1.0)
pH, UA: 6 (ref 5.0–7.5)

## 2023-09-10 LAB — PSA: Prostate Specific Ag, Serum: 7.9 ng/mL — ABNORMAL HIGH (ref 0.0–4.0)

## 2023-09-16 ENCOUNTER — Telehealth: Payer: Self-pay

## 2023-09-16 NOTE — Telephone Encounter (Signed)
Patient is made aware and voiced understanding.

## 2023-09-16 NOTE — Telephone Encounter (Signed)
-----   Message from Bertram Millard Dahlstedt sent at 09/15/2023 12:46 PM EDT ----- Let pt know that psa was up just over a point to 7.8--no need to do anything different at this point--cont scheduled f/u

## 2023-10-22 DIAGNOSIS — D075 Carcinoma in situ of prostate: Secondary | ICD-10-CM | POA: Diagnosis not present

## 2023-10-22 DIAGNOSIS — I10 Essential (primary) hypertension: Secondary | ICD-10-CM | POA: Diagnosis not present

## 2023-10-22 DIAGNOSIS — Z0000 Encounter for general adult medical examination without abnormal findings: Secondary | ICD-10-CM | POA: Diagnosis not present

## 2023-10-22 DIAGNOSIS — Z6830 Body mass index (BMI) 30.0-30.9, adult: Secondary | ICD-10-CM | POA: Diagnosis not present

## 2023-10-22 DIAGNOSIS — R7303 Prediabetes: Secondary | ICD-10-CM | POA: Diagnosis not present

## 2023-10-22 DIAGNOSIS — I480 Paroxysmal atrial fibrillation: Secondary | ICD-10-CM | POA: Diagnosis not present

## 2023-10-22 DIAGNOSIS — E782 Mixed hyperlipidemia: Secondary | ICD-10-CM | POA: Diagnosis not present

## 2023-11-06 ENCOUNTER — Ambulatory Visit: Payer: PPO | Admitting: Orthopaedic Surgery

## 2023-11-06 ENCOUNTER — Encounter: Payer: Self-pay | Admitting: Orthopaedic Surgery

## 2023-11-06 ENCOUNTER — Other Ambulatory Visit (INDEPENDENT_AMBULATORY_CARE_PROVIDER_SITE_OTHER): Payer: PPO

## 2023-11-06 VITALS — Ht 71.0 in | Wt 200.0 lb

## 2023-11-06 DIAGNOSIS — M25561 Pain in right knee: Secondary | ICD-10-CM

## 2023-11-06 DIAGNOSIS — M65961 Unspecified synovitis and tenosynovitis, right lower leg: Secondary | ICD-10-CM

## 2023-11-06 DIAGNOSIS — G8929 Other chronic pain: Secondary | ICD-10-CM

## 2023-11-06 MED ORDER — BUPIVACAINE HCL 0.25 % IJ SOLN
4.0000 mL | INTRAMUSCULAR | Status: AC | PRN
  Administered 2023-11-06: 4 mL via INTRA_ARTICULAR

## 2023-11-06 MED ORDER — LIDOCAINE HCL 1 % IJ SOLN
0.5000 mL | INTRAMUSCULAR | Status: AC | PRN
  Administered 2023-11-06: .5 mL

## 2023-11-06 MED ORDER — METHYLPREDNISOLONE ACETATE 40 MG/ML IJ SUSP
40.0000 mg | INTRAMUSCULAR | Status: AC | PRN
  Administered 2023-11-06: 40 mg via INTRA_ARTICULAR

## 2023-11-06 NOTE — Progress Notes (Signed)
Office Visit Note   Patient: Jeffrey [name]           Date of Birth: 1951-09-12           MRN: [id number] Visit Date: 11/06/2023              Requested by: Toma Deiters, MD [address] [place],  [place] [address] PCP: Toma Deiters, MD   Assessment & Plan: Visit Diagnoses:  1. Chronic pain of right knee   2. Synovitis of right knee     Plan: Knee aspirated with ice tea colored fluid somewhat bloody primarily knee synovitis.  Cortisone injection performed and he can ambulate in the room very comfortably and did not have any tenderness over the distal quad tendon post aspiration and injection.  Will place him in a knee sleeve with hinges and recheck him in 2 weeks.  He can continue with ice and ibuprofen.  Follow-Up Instructions: No follow-ups on file.   Orders:  Orders Placed This Encounter  Procedures   Large Joint Inj   XR KNEE 3 VIEW RIGHT   No orders of the defined types were placed in this encounter.     Procedures: Large Joint Inj: R knee on 11/06/2023 10:36 AM Indications: pain and joint swelling Details: 22 G 1.5 in needle, anterolateral approach  Arthrogram: No  Medications: 40 mg methylPREDNISolone acetate 40 MG/ML; 0.5 mL lidocaine 1 %; 4 mL bupivacaine 0.25 % Aspirate: 60 mL blood-tinged Outcome: tolerated well, no immediate complications Procedure, treatment alternatives, risks and benefits explained, specific risks discussed. Consent was given by the patient. Immediately prior to procedure a time out was called to verify the correct patient, procedure, equipment, support staff and site/side marked as required. Patient was prepped and draped in the usual sterile fashion.       Clinical Data: No additional findings.   Subjective: Chief Complaint  Patient presents with   Right Knee - Pain    HPI 72 year old male referred by Dr.Hasanaj with problems when his right knee went out on him on Monday he has been laying up for a few days states it  is hard to get in and out of the truck he heard a loud pop in his knee which was very painful.  He thought he might of dislocated his kneecap he is noticed swelling in his knee.  He has had some problems with this in the past with pain and swelling but nothing this severe.  He has been ambulating with a cane and using ibuprofen and elevation and ice.  Review of Systems has history of prostate cancer on MRI surveillance.   Objective: Vital Signs: Ht 5\' 11"  (1.803 m)   Wt 200 lb (90.7 kg)   BMI 27.89 kg/m   Physical Exam Constitutional:      Appearance: He is well-developed.  HENT:     Head: Normocephalic and atraumatic.     Right Ear: External ear normal.     Left Ear: External ear normal.  Eyes:     Pupils: Pupils are equal, round, and reactive to light.  Neck:     Thyroid: No thyromegaly.     Trachea: No tracheal deviation.  Cardiovascular:     Rate and Rhythm: Normal rate.  Pulmonary:     Effort: Pulmonary effort is normal.     Breath sounds: No wheezing.  Abdominal:     General: Bowel sounds are normal.     Palpations: Abdomen is soft.  Musculoskeletal:  Cervical back: Neck supple.  Skin:    General: Skin is warm and dry.     Capillary Refill: Capillary refill takes less than 2 seconds.  Neurological:     Mental Status: He is alert and oriented to person, place, and time.  Psychiatric:        Behavior: Behavior normal.        Thought Content: Thought content normal.        Judgment: Judgment normal.     Ortho Exam patient is effusion of the knee tenderness suprapatellar pouch tenderness distal quad tendon collateral ligaments are stable ACL PCL is normal.  Difficulty ambulating with right knee limp pronounced.  Specialty Comments:  No specialty comments available.  Imaging: XR KNEE 3 VIEW RIGHT  Result Date: 11/06/2023 Standing AP both knees lateral right knee sunrise tell x-ray right knee obtained and reviewed.  This shows some bilateral joint line  narrowing right less than left knee.  Left knee marginal osteophytes more lateral.  There is calcific tendinopathy of the quad tendon adjacent to the patella.  Patellar subluxation.  Right knee effusion noted. Impression: Quad tendon calcific tendinopathy with some right knee tricompartmental degenerative arthritis.    PMFS History: Patient Active Problem List   Diagnosis Date Noted   Synovitis of right knee 11/06/2023   Malignant neoplasm of prostate (HCC) 04/02/2021   Palpitations 02/25/2012   Past Medical History:  Diagnosis Date   Anxiety state, unspecified    Diabetes mellitus without complication (HCC)    Encounter for long-term (current) use of other medications    Gout    Hyperchloremia arthrit   Other and unspecified hyperlipidemia    Other malaise and fatigue    Palpitations    Precordial pain    Prostate cancer (HCC)    Pure hypercholesterolemia    Shortness of breath    Solitary pulmonary nodule    Syncope and collapse    Unspecified essential hypertension     Family History  Problem Relation Age of Onset   Heart disease Mother    Cancer Maternal Uncle    Breast cancer Neg Hx    Colon cancer Neg Hx    Pancreatic cancer Neg Hx    Prostate cancer Neg Hx     Past Surgical History:  Procedure Laterality Date   PROSTATE BIOPSY     WISDOM TOOTH EXTRACTION     Social History   Occupational History   Occupation: retired  Tobacco Use   Smoking status: Never   Smokeless tobacco: Never  Vaping Use   Vaping status: Never Used  Substance and Sexual Activity   Alcohol use: Yes    Alcohol/week: 0.0 standard drinks of alcohol    Comment: very rare   Drug use: No   Sexual activity: Yes

## 2023-11-20 ENCOUNTER — Encounter: Payer: Self-pay | Admitting: Orthopaedic Surgery

## 2023-11-20 ENCOUNTER — Ambulatory Visit: Payer: PPO | Admitting: Orthopaedic Surgery

## 2023-11-20 VITALS — Ht 71.0 in | Wt 200.0 lb

## 2023-11-20 DIAGNOSIS — M65961 Unspecified synovitis and tenosynovitis, right lower leg: Secondary | ICD-10-CM

## 2023-11-20 DIAGNOSIS — M1711 Unilateral primary osteoarthritis, right knee: Secondary | ICD-10-CM | POA: Diagnosis not present

## 2023-11-20 NOTE — Progress Notes (Signed)
Office Visit Note   Patient: Jeffrey [name]           Date of Birth: 08/21/1951           MRN: [id number] Visit Date: 11/20/2023              Requested by: Toma Deiters, MD [address] [place],  [place] [address] PCP: Toma Deiters, MD   Assessment & Plan: Visit Diagnoses:  1. Synovitis of right knee   2. Unilateral primary osteoarthritis, right knee     Plan: Patient significantly improved with the injection.  He can return if he has ongoing symptoms.  If he develops locking he will let us know.  Follow-Up Instructions: No follow-ups on file.   Orders:  No orders of the defined types were placed in this encounter.  No orders of the defined types were placed in this encounter.     Procedures: No procedures performed   Clinical Data: No additional findings.   Subjective: Chief Complaint  Patient presents with   Right Knee - Pain, Follow-up    HPI 72 year old male returns for follow-up right knee with aspiration and injection last visit 11/06/2023.  He states his D knee is doing much better he is not walking with a limp.  Swelling is down and improvement in the discomfort.  Occasionally still feels like the kneecap is jumping or grinding.  He is sleeping better.  He is using a knee sleeve intermittently.  Review of Systems all systems updated unchanged from 11/06/2023.   Objective: Vital Signs: Ht 5\' 11"  (1.803 m)   Wt 200 lb (90.7 kg)   BMI 27.89 kg/m   Physical Exam Constitutional:      Appearance: He is well-developed.  HENT:     Head: Normocephalic and atraumatic.     Right Ear: External ear normal.     Left Ear: External ear normal.  Eyes:     Pupils: Pupils are equal, round, and reactive to light.  Neck:     Thyroid: No thyromegaly.     Trachea: No tracheal deviation.  Cardiovascular:     Rate and Rhythm: Normal rate.  Pulmonary:     Effort: Pulmonary effort is normal.     Breath sounds: No wheezing.  Abdominal:     General: Bowel  sounds are normal.     Palpations: Abdomen is soft.  Musculoskeletal:     Cervical back: Neck supple.  Skin:    General: Skin is warm and dry.     Capillary Refill: Capillary refill takes less than 2 seconds.  Neurological:     Mental Status: He is alert and oriented to person, place, and time.  Psychiatric:        Behavior: Behavior normal.        Thought Content: Thought content normal.        Judgment: Judgment normal.     Ortho Exam crepitus both knee range of motion full extension good flexion.  Mild crepitus with patellofemoral loading right knee.  Trace effusion right knee negative logroll hips.  He is ambulating without a limp.  Specialty Comments:  No specialty comments available.  Imaging: No results found.   PMFS History: Patient Active Problem List   Diagnosis Date Noted   Unilateral primary osteoarthritis, right knee 11/20/2023   Synovitis of right knee 11/06/2023   Malignant neoplasm of prostate (HCC) 04/02/2021   Palpitations 02/25/2012   Past Medical History:  Diagnosis Date   Anxiety  state, unspecified    Diabetes mellitus without complication (HCC)    Encounter for long-term (current) use of other medications    Gout    Hyperchloremia arthrit   Other and unspecified hyperlipidemia    Other malaise and fatigue    Palpitations    Precordial pain    Prostate cancer (HCC)    Pure hypercholesterolemia    Shortness of breath    Solitary pulmonary nodule    Syncope and collapse    Unspecified essential hypertension     Family History  Problem Relation Age of Onset   Heart disease Mother    Cancer Maternal Uncle    Breast cancer Neg Hx    Colon cancer Neg Hx    Pancreatic cancer Neg Hx    Prostate cancer Neg Hx     Past Surgical History:  Procedure Laterality Date   PROSTATE BIOPSY     WISDOM TOOTH EXTRACTION     Social History   Occupational History   Occupation: retired  Tobacco Use   Smoking status: Never   Smokeless tobacco: Never   Vaping Use   Vaping status: Never Used  Substance and Sexual Activity   Alcohol use: Yes    Alcohol/week: 0.0 standard drinks of alcohol    Comment: very rare   Drug use: No   Sexual activity: Yes

## 2023-12-12 DIAGNOSIS — I739 Peripheral vascular disease, unspecified: Secondary | ICD-10-CM | POA: Diagnosis not present

## 2023-12-18 DIAGNOSIS — I10 Essential (primary) hypertension: Secondary | ICD-10-CM | POA: Diagnosis not present

## 2023-12-18 DIAGNOSIS — Z6831 Body mass index (BMI) 31.0-31.9, adult: Secondary | ICD-10-CM | POA: Diagnosis not present

## 2023-12-18 DIAGNOSIS — J0191 Acute recurrent sinusitis, unspecified: Secondary | ICD-10-CM | POA: Diagnosis not present

## 2023-12-30 DIAGNOSIS — M810 Age-related osteoporosis without current pathological fracture: Secondary | ICD-10-CM | POA: Diagnosis not present

## 2023-12-30 DIAGNOSIS — C61 Malignant neoplasm of prostate: Secondary | ICD-10-CM | POA: Diagnosis not present

## 2024-01-26 DIAGNOSIS — H401231 Low-tension glaucoma, bilateral, mild stage: Secondary | ICD-10-CM | POA: Diagnosis not present

## 2024-01-28 DIAGNOSIS — E66812 Obesity, class 2: Secondary | ICD-10-CM | POA: Diagnosis not present

## 2024-01-28 DIAGNOSIS — M310 Hypersensitivity angiitis: Secondary | ICD-10-CM | POA: Diagnosis not present

## 2024-01-28 DIAGNOSIS — Z6831 Body mass index (BMI) 31.0-31.9, adult: Secondary | ICD-10-CM | POA: Diagnosis not present

## 2024-01-28 DIAGNOSIS — Z125 Encounter for screening for malignant neoplasm of prostate: Secondary | ICD-10-CM | POA: Diagnosis not present

## 2024-01-28 DIAGNOSIS — I10 Essential (primary) hypertension: Secondary | ICD-10-CM | POA: Diagnosis not present

## 2024-01-28 DIAGNOSIS — E7849 Other hyperlipidemia: Secondary | ICD-10-CM | POA: Diagnosis not present

## 2024-01-28 DIAGNOSIS — I480 Paroxysmal atrial fibrillation: Secondary | ICD-10-CM | POA: Diagnosis not present

## 2024-01-28 DIAGNOSIS — Z0000 Encounter for general adult medical examination without abnormal findings: Secondary | ICD-10-CM | POA: Diagnosis not present

## 2024-01-28 DIAGNOSIS — D075 Carcinoma in situ of prostate: Secondary | ICD-10-CM | POA: Diagnosis not present

## 2024-03-08 ENCOUNTER — Other Ambulatory Visit: Payer: PPO

## 2024-03-08 DIAGNOSIS — C61 Malignant neoplasm of prostate: Secondary | ICD-10-CM

## 2024-03-09 ENCOUNTER — Encounter: Payer: Self-pay | Admitting: Urology

## 2024-03-09 LAB — PSA: Prostate Specific Ag, Serum: 8.4 ng/mL — ABNORMAL HIGH (ref 0.0–4.0)

## 2024-03-14 NOTE — Progress Notes (Signed)
 History of Present Illness: Here for continued f/u of PCa--on AS.  TRUSP/Bx 1.11.2022:   PSA: 4.3 Prostate volume 46.4 mL. PSAD 0.09. Staging: T1c GS: 3+4 (Right Apex) Positive cores/Total cores: 1/12    Pt decided on AS  12.6.2022: PSA  5.3.    1.7.2023: MRI prostate. 1. Small PI-RADS category 4 lesion of the right posteromedial peripheral zone at the apex. 2. No other lesions of intermediate or higher suspicion for malignancy are identified. 3. Mild benign prostatic hypertrophy. Upper normal prostate gland size.   2.15.2023: Fusion TRUS/Bx in [place] @ [facility].  Volume 39 mL. All 4 cores from region of interest were negative-benign fibromuscular tissue 1/12 cores from systematic biopsy revealed GS 3+3 pattern adenocarcinoma in right apex medial-10% of core.  Small amount of atypical cells seen left base lateral, high-grade PIN seen in right base  8.16.2023: PSA 7.9 3.12.2024: PSA 6.5 9.10.2024: PSA 7.9 3.18.2025: PSA 8.4  Past Medical History:  Diagnosis Date   Anxiety state, unspecified    Diabetes mellitus without complication (HCC)    Encounter for long-term (current) use of other medications    Gout    Hyperchloremia arthrit   Other and unspecified hyperlipidemia    Other malaise and fatigue    Palpitations    Precordial pain    Prostate cancer (HCC)    Pure hypercholesterolemia    Shortness of breath    Solitary pulmonary nodule    Syncope and collapse    Unspecified essential hypertension     Past Surgical History:  Procedure Laterality Date   PROSTATE BIOPSY     WISDOM TOOTH EXTRACTION      Home Medications:  Allergies as of 03/16/2024   No Known Allergies      Medication List        Accurate as of March 14, 2024  8:21 AM. If you have any questions, ask your nurse or doctor.          diltiazem 60 MG tablet Commonly known as: CARDIZEM TAKE 1 TABLET BY MOUTH TWICE A DAY   ferrous sulfate 325 (65 FE) MG tablet Take 325 mg by mouth 2 (two)  times daily.   fish oil-omega-3 fatty acids 1000 MG capsule Take 1 g by mouth daily.   lisinopril 10 MG tablet Commonly known as: ZESTRIL Take 10 mg by mouth daily.   OneTouch Verio test strip Generic drug: glucose blood daily.   pravastatin 80 MG tablet Commonly known as: PRAVACHOL Take 80 mg by mouth at bedtime.   sildenafil 100 MG tablet Commonly known as: VIAGRA Take 1 tablet (100 mg total) by mouth as needed for erectile dysfunction.   Vitamin D3 50 MCG (2000 UT) Tabs Take 2,000 Units by mouth daily.        Allergies: No Known Allergies  Family History  Problem Relation Age of Onset   Heart disease Mother    Cancer Maternal Uncle    Breast cancer Neg Hx    Colon cancer Neg Hx    Pancreatic cancer Neg Hx    Prostate cancer Neg Hx     Social History:  reports that he has never smoked. He has never used smokeless tobacco. He reports current alcohol use. He reports that he does not use drugs.  ROS: A complete review of systems was performed.  All systems are negative except for pertinent findings as noted.  Physical Exam:  Vital signs in last 24 hours: There were no vitals taken for this visit. Constitutional:  Alert and oriented, No acute distress Cardiovascular: Regular rate  Respiratory: Normal respiratory effort Genitourinary: No sphincter tone.  Prostate 40 mL, symmetric, nonnodular, nontender. Lymphatic: No lymphadenopathy Neurologic: Grossly intact, no focal deficits Psychiatric: Normal mood and affect  I have reviewed prior pt notes  I have reviewed urinalysis results--clear  I have independently reviewed prior imaging--prior prostate ultrasound and MRI  I have reviewed prior PSA, pathology results   Impression/Assessment:  1.  Grade group 2 prostate cancer based on first biopsy in 2022.  Surveillance biopsy revealed grade group 1 features, both biopsies revealed very low-volume.  He is stable  2.  ED, not treated  Plan:

## 2024-03-16 ENCOUNTER — Ambulatory Visit: Payer: PPO | Admitting: Urology

## 2024-03-16 VITALS — BP 148/87 | HR 74

## 2024-03-16 DIAGNOSIS — C61 Malignant neoplasm of prostate: Secondary | ICD-10-CM

## 2024-03-16 DIAGNOSIS — N400 Enlarged prostate without lower urinary tract symptoms: Secondary | ICD-10-CM | POA: Diagnosis not present

## 2024-03-16 DIAGNOSIS — N5201 Erectile dysfunction due to arterial insufficiency: Secondary | ICD-10-CM

## 2024-04-09 DIAGNOSIS — M170 Bilateral primary osteoarthritis of knee: Secondary | ICD-10-CM | POA: Diagnosis not present

## 2024-04-28 DIAGNOSIS — E66812 Obesity, class 2: Secondary | ICD-10-CM | POA: Diagnosis not present

## 2024-04-28 DIAGNOSIS — Z6831 Body mass index (BMI) 31.0-31.9, adult: Secondary | ICD-10-CM | POA: Diagnosis not present

## 2024-04-28 DIAGNOSIS — I10 Essential (primary) hypertension: Secondary | ICD-10-CM | POA: Diagnosis not present

## 2024-04-28 DIAGNOSIS — D075 Carcinoma in situ of prostate: Secondary | ICD-10-CM | POA: Diagnosis not present

## 2024-04-28 DIAGNOSIS — E7849 Other hyperlipidemia: Secondary | ICD-10-CM | POA: Diagnosis not present

## 2024-04-28 DIAGNOSIS — I480 Paroxysmal atrial fibrillation: Secondary | ICD-10-CM | POA: Diagnosis not present

## 2024-04-28 DIAGNOSIS — Z0000 Encounter for general adult medical examination without abnormal findings: Secondary | ICD-10-CM | POA: Diagnosis not present

## 2024-04-28 DIAGNOSIS — M310 Hypersensitivity angiitis: Secondary | ICD-10-CM | POA: Diagnosis not present

## 2024-04-30 ENCOUNTER — Encounter: Payer: Self-pay | Admitting: Radiology

## 2024-06-29 DIAGNOSIS — H5201 Hypermetropia, right eye: Secondary | ICD-10-CM | POA: Diagnosis not present

## 2024-07-07 DIAGNOSIS — A932 Colorado tick fever: Secondary | ICD-10-CM | POA: Diagnosis not present

## 2024-07-07 DIAGNOSIS — Z6830 Body mass index (BMI) 30.0-30.9, adult: Secondary | ICD-10-CM | POA: Diagnosis not present

## 2024-07-20 DIAGNOSIS — I480 Paroxysmal atrial fibrillation: Secondary | ICD-10-CM | POA: Diagnosis not present

## 2024-07-20 DIAGNOSIS — A932 Colorado tick fever: Secondary | ICD-10-CM | POA: Diagnosis not present

## 2024-07-20 DIAGNOSIS — R7303 Prediabetes: Secondary | ICD-10-CM | POA: Diagnosis not present

## 2024-07-20 DIAGNOSIS — Z8546 Personal history of malignant neoplasm of prostate: Secondary | ICD-10-CM | POA: Diagnosis not present

## 2024-07-20 DIAGNOSIS — E66812 Obesity, class 2: Secondary | ICD-10-CM | POA: Diagnosis not present

## 2024-07-20 DIAGNOSIS — I10 Essential (primary) hypertension: Secondary | ICD-10-CM | POA: Diagnosis not present

## 2024-07-20 DIAGNOSIS — D075 Carcinoma in situ of prostate: Secondary | ICD-10-CM | POA: Diagnosis not present

## 2024-07-20 DIAGNOSIS — Z6830 Body mass index (BMI) 30.0-30.9, adult: Secondary | ICD-10-CM | POA: Diagnosis not present

## 2024-07-20 DIAGNOSIS — E7849 Other hyperlipidemia: Secondary | ICD-10-CM | POA: Diagnosis not present

## 2024-07-21 DIAGNOSIS — N186 End stage renal disease: Secondary | ICD-10-CM | POA: Diagnosis not present

## 2024-07-21 DIAGNOSIS — Z992 Dependence on renal dialysis: Secondary | ICD-10-CM | POA: Diagnosis not present

## 2024-08-15 NOTE — Progress Notes (Unsigned)
 [facility] [phone]   [phone]  INITIAL CONSULT NOTE  Patient Care Team: Orpha Yancey LABOR, MD as PCP - General (Unknown Physician Specialty) Alvan Dorn FALCON, MD as PCP - Cardiology (Cardiology)  Hematological/Oncological History Labs from PCP Dr. Phil WBC 1.8 (L), Hgb 15.1, Plt 151, ANC 1.0 (L), ALC 0.5 (L). 08/16/2024: Establish care with [facility]  CHIEF COMPLAINTS/PURPOSE OF CONSULTATION:  ***   HISTORY OF PRESENTING ILLNESS:  Jeffrey [name] 73 y.o. male with medical history significant for ***  On review of the previous records ***  On exam today ***  MEDICAL HISTORY:  Past Medical History:  Diagnosis Date   Anxiety state, unspecified    Diabetes mellitus without complication (HCC)    Encounter for long-term (current) use of other medications    Gout    Hyperchloremia arthrit   Other and unspecified hyperlipidemia    Other malaise and fatigue    Palpitations    Precordial pain    Prostate cancer (HCC)    Pure hypercholesterolemia    Shortness of breath    Solitary pulmonary nodule    Syncope and collapse    Unspecified essential hypertension     SURGICAL HISTORY: Past Surgical History:  Procedure Laterality Date   PROSTATE BIOPSY     WISDOM TOOTH EXTRACTION      SOCIAL HISTORY: Social History   Socioeconomic History   Marital status: Married    Spouse name: JUANITA   Number of children: 2   Years of education: Not on file   Highest education level: Not on file  Occupational History   Occupation: retired  Tobacco Use   Smoking status: Never   Smokeless tobacco: Never  Vaping Use   Vaping status: Never Used  Substance and Sexual Activity   Alcohol use: Yes    Alcohol/week: 0.0 standard drinks of alcohol    Comment: very rare   Drug use: No   Sexual activity: Yes  Other Topics Concern   Not on file  Social History Narrative   Not on file   Social Drivers of Health   Financial  Resource Strain: Not on file  Food Insecurity: Not on file  Transportation Needs: Not on file  Physical Activity: Not on file  Stress: Not on file  Social Connections: Not on file  Intimate Partner Violence: Not on file    FAMILY HISTORY: Family History  Problem Relation Age of Onset   Heart disease Mother    Cancer Maternal Uncle    Breast cancer Neg Hx    Colon cancer Neg Hx    Pancreatic cancer Neg Hx    Prostate cancer Neg Hx     ALLERGIES:  has no known allergies.  MEDICATIONS:  Current Outpatient Medications  Medication Sig Dispense Refill   Cholecalciferol (VITAMIN D3) 2000 UNITS TABS Take 2,000 Units by mouth daily.      diltiazem  (CARDIZEM ) 60 MG tablet TAKE 1 TABLET BY MOUTH TWICE A DAY 60 tablet 0   ferrous sulfate 325 (65 FE) MG tablet Take 325 mg by mouth 2 (two) times daily.  0   fish oil-omega-3 fatty acids 1000 MG capsule Take 1 g by mouth daily.     ONETOUCH VERIO test strip daily.     pravastatin (PRAVACHOL) 80 MG tablet Take 80 mg by mouth at bedtime.     sildenafil  (VIAGRA ) 100 MG tablet Take 1 tablet (100 mg total) by mouth as needed for erectile dysfunction. 20 tablet 11  No current facility-administered medications for this visit.    REVIEW OF SYSTEMS:   Constitutional: ( - ) fevers, ( - )  chills , ( - ) night sweats Eyes: ( - ) blurriness of vision, ( - ) double vision, ( - ) watery eyes Ears, nose, mouth, throat, and face: ( - ) mucositis, ( - ) sore throat Respiratory: ( - ) cough, ( - ) dyspnea, ( - ) wheezes Cardiovascular: ( - ) palpitation, ( - ) chest discomfort, ( - ) lower extremity swelling Gastrointestinal:  ( - ) nausea, ( - ) heartburn, ( - ) change in bowel habits Skin: ( - ) abnormal skin rashes Lymphatics: ( - ) new lymphadenopathy, ( - ) easy bruising Neurological: ( - ) numbness, ( - ) tingling, ( - ) new weaknesses Behavioral/Psych: ( - ) mood change, ( - ) new changes  All other systems were reviewed with the patient and are  negative.  PHYSICAL EXAMINATION: ECOG PERFORMANCE STATUS: {CHL ONC ECOG [phone]}  There were no vitals filed for this visit. There were no vitals filed for this visit.  GENERAL: well appearing *** in NAD  SKIN: skin color, texture, turgor are normal, no rashes or significant lesions EYES: conjunctiva are pink and non-injected, sclera clear OROPHARYNX: no exudate, no erythema; lips, buccal mucosa, and tongue normal  NECK: supple, non-tender LYMPH:  no palpable lymphadenopathy in the cervical, axillary or supraclavicular lymph nodes.  LUNGS: clear to auscultation and percussion with normal breathing effort HEART: regular rate & rhythm and no murmurs and no lower extremity edema ABDOMEN: soft, non-tender, non-distended, normal bowel sounds Musculoskeletal: no cyanosis of digits and no clubbing  PSYCH: alert & oriented x 3, fluent speech NEURO: no focal motor/sensory deficits  LABORATORY DATA:  I have reviewed the data as listed    Latest Ref Rng & Units 05/09/2015    2:31 PM  CBC  Hemoglobin 13.0 - 17.0 g/dL 85.3   Hematocrit 60.9 - 52.0 % 43.0        Latest Ref Rng & Units 12/04/2021    5:18 PM 05/09/2015    2:31 PM  CMP  Glucose 70 - 99 mg/dL  94   BUN 8 - 27 mg/dL 18  8   Creatinine 9.23 - 1.27 mg/dL 8.91  9.09   Sodium 864 - 145 mmol/L  140   Potassium 3.5 - 5.1 mmol/L  4.4   Chloride 101 - 111 mmol/L  105      PATHOLOGY: ***  BLOOD FILM: *** Review of the peripheral blood smear showed normal appearing white cells with neutrophils that were appropriately lobated and granulated. There was no predominance of bi-lobed or hyper-segmented neutrophils appreciated. No Dohle bodies were noted. There was no left shifting, immature forms or blasts noted. Lymphocytes remain normal in size without any predominance of large granular lymphocytes. Red cells show no anisopoikilocytosis, macrocytes , microcytes or polychromasia. There were no schistocytes, target cells,  echinocytes, acanthocytes, dacrocytes, or stomatocytes.There was no rouleaux formation, nucleated red cells, or intra-cellular inclusions noted. The platelets are normal in size, shape, and color without any clumping evident.  RADIOGRAPHIC STUDIES: I have personally reviewed the radiological images as listed and agreed with the findings in the report. No results found.  ASSESSMENT & PLAN ***  The differentials for neutropenia include benign ethnic neutropenia, infectious etiology, nutritional etiology, inflammatory etiology, or medication.  The patient *** currently taking any medications known to cause neutropenia.  We will order viral studies  with hep B, hep C, and HIV as well.  In addition, we will check for nutritional anemias with B12 and folate levels.      # Leukopenia/Neutropenia  --Rule out infectious etiology with hepatitis B serologies, hepatitis C serologies, and HIV.  --Nutritional evaluation with  vitamin B12, MMA and folate.  --Inflammatory evaluation with ESR and CRP.   --Repeat CBC and CMP.  Additionally, we will order peripheral blood film.  --Assuming there are no concerning findings on the blood work-up today. we will plan to see the patient back in approximately 6 months time.    No orders of the defined types were placed in this encounter.   All questions were answered. The patient knows to call the clinic with any problems, questions or concerns.  I have spent a total of {CHL ONC TIME VISIT - DTPQU:[id number]} minutes of face-to-face and non-face-to-face time, preparing to see the patient, obtaining and/or reviewing separately obtained history, performing a medically appropriate examination, counseling and educating the patient, ordering medications/tests/procedures, referring and communicating with other health care professionals, documenting clinical information in the electronic health record, independently interpreting results and communicating results to the  patient, and care coordination.   Johnston Police, PA-C Department of Hematology/Oncology [facility] Phone: [phone]

## 2024-08-16 ENCOUNTER — Inpatient Hospital Stay

## 2024-08-16 ENCOUNTER — Encounter: Payer: Self-pay | Admitting: Physician Assistant

## 2024-08-16 ENCOUNTER — Inpatient Hospital Stay: Attending: Physician Assistant | Admitting: Physician Assistant

## 2024-08-16 VITALS — BP 137/83 | HR 69 | Temp 97.5°F | Resp 18 | Wt 203.1 lb

## 2024-08-16 DIAGNOSIS — I10 Essential (primary) hypertension: Secondary | ICD-10-CM | POA: Insufficient documentation

## 2024-08-16 DIAGNOSIS — R002 Palpitations: Secondary | ICD-10-CM | POA: Insufficient documentation

## 2024-08-16 DIAGNOSIS — Z8249 Family history of ischemic heart disease and other diseases of the circulatory system: Secondary | ICD-10-CM | POA: Insufficient documentation

## 2024-08-16 DIAGNOSIS — Z809 Family history of malignant neoplasm, unspecified: Secondary | ICD-10-CM | POA: Insufficient documentation

## 2024-08-16 DIAGNOSIS — D708 Other neutropenia: Secondary | ICD-10-CM

## 2024-08-16 DIAGNOSIS — D709 Neutropenia, unspecified: Secondary | ICD-10-CM | POA: Insufficient documentation

## 2024-08-16 DIAGNOSIS — Z79899 Other long term (current) drug therapy: Secondary | ICD-10-CM | POA: Diagnosis not present

## 2024-08-16 DIAGNOSIS — R7303 Prediabetes: Secondary | ICD-10-CM | POA: Diagnosis not present

## 2024-08-16 DIAGNOSIS — C61 Malignant neoplasm of prostate: Secondary | ICD-10-CM | POA: Diagnosis not present

## 2024-08-16 LAB — C-REACTIVE PROTEIN: CRP: 0.5 mg/dL (ref <1.0)

## 2024-08-16 LAB — CBC WITH DIFFERENTIAL/PLATELET
Abs Immature Granulocytes: 0.01 K/uL (ref 0.00–0.07)
Basophils Absolute: 0 K/uL (ref 0.0–0.1)
Basophils Relative: 1 %
Eosinophils Absolute: 0.1 K/uL (ref 0.0–0.5)
Eosinophils Relative: 3 %
HCT: 45.1 % (ref 39.0–52.0)
Hemoglobin: 14.3 g/dL (ref 13.0–17.0)
Immature Granulocytes: 0 %
Lymphocytes Relative: 42 %
Lymphs Abs: 1.5 K/uL (ref 0.7–4.0)
MCH: 27.6 pg (ref 26.0–34.0)
MCHC: 31.7 g/dL (ref 30.0–36.0)
MCV: 87.1 fL (ref 80.0–100.0)
Monocytes Absolute: 0.3 K/uL (ref 0.1–1.0)
Monocytes Relative: 9 %
Neutro Abs: 1.6 K/uL — ABNORMAL LOW (ref 1.7–7.7)
Neutrophils Relative %: 45 %
Platelets: 260 K/uL (ref 150–400)
RBC: 5.18 MIL/uL (ref 4.22–5.81)
RDW: 13.3 % (ref 11.5–15.5)
WBC: 3.5 K/uL — ABNORMAL LOW (ref 4.0–10.5)
nRBC: 0 % (ref 0.0–0.2)

## 2024-08-16 LAB — COMPREHENSIVE METABOLIC PANEL WITH GFR
ALT: 15 U/L (ref 0–44)
AST: 24 U/L (ref 15–41)
Albumin: 3.9 g/dL (ref 3.5–5.0)
Alkaline Phosphatase: 70 U/L (ref 38–126)
Anion gap: 9 (ref 5–15)
BUN: 14 mg/dL (ref 8–23)
CO2: 24 mmol/L (ref 22–32)
Calcium: 9.1 mg/dL (ref 8.9–10.3)
Chloride: 103 mmol/L (ref 98–111)
Creatinine, Ser: 0.84 mg/dL (ref 0.61–1.24)
GFR, Estimated: >60 mL/min (ref >60)
Glucose, Bld: 89 mg/dL (ref 70–99)
Potassium: 3.9 mmol/L (ref 3.5–5.1)
Sodium: 136 mmol/L (ref 135–145)
Total Bilirubin: 0.8 mg/dL (ref 0.0–1.2)
Total Protein: 7.3 g/dL (ref 6.5–8.1)

## 2024-08-16 LAB — TECHNOLOGIST SMEAR REVIEW

## 2024-08-16 LAB — SEDIMENTATION RATE: Sed Rate: 4 mm/h (ref 0–20)

## 2024-08-16 LAB — FOLATE: Folate: 12.1 ng/mL (ref >5.9)

## 2024-08-16 LAB — HEPATITIS B SURFACE ANTIGEN: Hepatitis B Surface Ag: NONREACTIVE

## 2024-08-16 LAB — VITAMIN B12: Vitamin B-12: 416 pg/mL (ref 180–914)

## 2024-08-16 LAB — HEPATITIS B SURFACE ANTIBODY,QUALITATIVE: Hep B S Ab: NONREACTIVE

## 2024-08-16 LAB — HIV ANTIBODY (ROUTINE TESTING W REFLEX): HIV Screen 4th Generation wRfx: NONREACTIVE

## 2024-08-17 LAB — HEPATITIS B CORE ANTIBODY, TOTAL: HEP B CORE AB: NEGATIVE

## 2024-08-20 ENCOUNTER — Telehealth: Payer: Self-pay | Admitting: Physician Assistant

## 2024-08-20 NOTE — Telephone Encounter (Signed)
 I called and spoke to Jeffrey [name] to review the lab results from 08/16/2024. Findings show leukopenia/neutropenia has improved and nearly resolved with WBC 3.5, ANC 1.6. No other cytopenias noted. Remaining workup was negative with no evidence of nutritional deficiencies, hepatitis B/C or HIV. In addition, the inflammatory markers were normal. 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 Recommend to monitor at this time and return in 6 months with repeat labs. Advised patient to reach out if there is worsening WBC in the interim.   Jeffrey [name] expressed understanding of the plan provided.

## 2024-08-21 LAB — METHYLMALONIC ACID, SERUM: Methylmalonic Acid, Quantitative: 110 nmol/L (ref 0–378)

## 2024-09-13 ENCOUNTER — Other Ambulatory Visit

## 2024-09-13 DIAGNOSIS — C61 Malignant neoplasm of prostate: Secondary | ICD-10-CM

## 2024-09-14 ENCOUNTER — Ambulatory Visit: Payer: Self-pay | Admitting: Urology

## 2024-09-14 LAB — PSA: Prostate Specific Ag, Serum: 9.2 ng/mL — ABNORMAL HIGH (ref 0.0–4.0)

## 2024-09-20 NOTE — Progress Notes (Unsigned)
 Impression/Assessment:  1.  Grade group 2 prostate cancer based on first biopsy in 2022.  Surveillance biopsy revealed grade group 1 features, both biopsies revealed very low-volume.  PSA has been fairly stable.  2.  ED, not treated  Plan:  -I will have him come back in 6 months following PSA  -We will plan on MRI in 1 year.  History of Present Illness: Here for continued f/u of PCa--on AS.  TRUSP/Bx 1.11.2022:   PSA: 4.3 Prostate volume 46.4 mL. PSAD 0.09. Staging: T1c GS: 3+4 (Right Apex) Positive cores/Total cores: 1/12    Pt decided on AS  12.6.2022: PSA  5.3.    1.7.2023: MRI prostate.  Prostate volume 37 mL. 1. Small PI-RADS category 4 lesion of the right posteromedial peripheral zone at the apex. 2. No other lesions of intermediate or higher suspicion for malignancy are identified. 3. Mild benign prostatic hypertrophy. Upper normal prostate gland size.   2.15.2023: Fusion TRUS/Bx in [place] @ [facility].  Volume 39 mL. All 4 cores from region of interest were negative-benign fibromuscular tissue 1/12 cores from systematic biopsy revealed GS 3+3 pattern adenocarcinoma in right apex medial-10% of core.  Small amount of atypical cells seen left base lateral, high-grade PIN seen in right base  8.16.2023: PSA 7.9 3.12.2024: PSA 6.5 9.10.2024: PSA 7.9 3.18.2025: PSA 8.4.    9.23.2025: Her for routine check. PSA 9.2  Past Medical History:  Diagnosis Date   Anxiety state, unspecified    Encounter for long-term (current) use of other medications    Gout    Hyperchloremia arthrit   Other and unspecified hyperlipidemia    Other malaise and fatigue    Palpitations    Precordial pain    Prediabetes    Prostate cancer (HCC)    Pure hypercholesterolemia    Shortness of breath    Solitary pulmonary nodule    Syncope and collapse    Unspecified essential hypertension     Past Surgical History:  Procedure Laterality Date   PROSTATE BIOPSY     WISDOM TOOTH EXTRACTION       Home Medications:  Allergies as of 09/21/2024   No Known Allergies      Medication List        Accurate as of September 20, 2024  6:38 PM. If you have any questions, ask your nurse or doctor.          diltiazem  60 MG tablet Commonly known as: CARDIZEM  TAKE 1 TABLET BY MOUTH TWICE A DAY   ferrous sulfate 325 (65 FE) MG tablet Take 325 mg by mouth 2 (two) times daily.   fish oil-omega-3 fatty acids 1000 MG capsule Take 1 g by mouth daily.   OneTouch Verio test strip Generic drug: glucose blood daily.   pravastatin 80 MG tablet Commonly known as: PRAVACHOL Take 80 mg by mouth at bedtime.   sildenafil  100 MG tablet Commonly known as: VIAGRA  Take 1 tablet (100 mg total) by mouth as needed for erectile dysfunction.   Vitamin D3 50 MCG (2000 UT) Tabs Take 2,000 Units by mouth daily.        Allergies: No Known Allergies  Family History  Problem Relation Age of Onset   Heart disease Mother    Cancer Maternal Uncle    Breast cancer Neg Hx    Colon cancer Neg Hx    Pancreatic cancer Neg Hx    Prostate cancer Neg Hx     Social History:  reports that he has never smoked.  He has never used smokeless tobacco. He reports current alcohol use. He reports that he does not use drugs.  ROS: A complete review of systems was performed.  All systems are negative except for pertinent findings as noted.  Physical Exam:  Vital signs in last 24 hours: There were no vitals taken for this visit. Constitutional:  Alert and oriented, No acute distress Cardiovascular: Regular rate  Respiratory: Normal respiratory effort Genitourinary: No sphincter tone.  Prostate 40 mL, symmetric, nonnodular, nontender. Lymphatic: No lymphadenopathy Neurologic: Grossly intact, no focal deficits Psychiatric: Normal mood and affect  I have reviewed prior pt notes  I have reviewed urinalysis results--clear  I have independently reviewed prior imaging--prior prostate ultrasound and  MRI  I have reviewed prior PSA and pathology results

## 2024-09-21 ENCOUNTER — Other Ambulatory Visit: Payer: Self-pay

## 2024-09-21 ENCOUNTER — Ambulatory Visit: Admitting: Urology

## 2024-09-21 VITALS — BP 138/83 | HR 76

## 2024-09-21 DIAGNOSIS — N5201 Erectile dysfunction due to arterial insufficiency: Secondary | ICD-10-CM

## 2024-09-21 DIAGNOSIS — C61 Malignant neoplasm of prostate: Secondary | ICD-10-CM | POA: Diagnosis not present

## 2024-09-21 DIAGNOSIS — N529 Male erectile dysfunction, unspecified: Secondary | ICD-10-CM | POA: Diagnosis not present

## 2024-09-21 LAB — URINALYSIS, ROUTINE W REFLEX MICROSCOPIC
Bilirubin, UA: NEGATIVE
Glucose, UA: NEGATIVE
Ketones, UA: NEGATIVE
Leukocytes,UA: NEGATIVE
Nitrite, UA: NEGATIVE
Protein,UA: NEGATIVE
RBC, UA: NEGATIVE
Specific Gravity, UA: <1.005 — ABNORMAL LOW (ref 1.005–1.030)
Urobilinogen, Ur: 0.2 mg/dL (ref 0.2–1.0)
pH, UA: 6 (ref 5.0–7.5)

## 2024-10-05 ENCOUNTER — Ambulatory Visit (HOSPITAL_COMMUNITY)
Admission: RE | Admit: 2024-10-05 | Discharge: 2024-10-05 | Disposition: A | Discharge status: Home / Self Care | Source: Ambulatory Visit | Attending: Urology | Admitting: Urology

## 2024-10-05 DIAGNOSIS — K5730 Diverticulosis of large intestine without perforation or abscess without bleeding: Secondary | ICD-10-CM | POA: Diagnosis not present

## 2024-10-05 DIAGNOSIS — C61 Malignant neoplasm of prostate: Secondary | ICD-10-CM | POA: Diagnosis not present

## 2024-10-05 DIAGNOSIS — N400 Enlarged prostate without lower urinary tract symptoms: Secondary | ICD-10-CM | POA: Diagnosis not present

## 2024-10-05 MED ORDER — GADOBUTROL 1 MMOL/ML IV SOLN
9.0000 mL | Freq: Once | INTRAVENOUS | Status: AC | PRN
Start: 2024-10-05 — End: 2024-10-05
  Administered 2024-10-05: 9 mL via INTRAVENOUS

## 2024-10-13 ENCOUNTER — Other Ambulatory Visit: Payer: Self-pay | Admitting: Urology

## 2024-10-13 ENCOUNTER — Ambulatory Visit: Payer: Self-pay | Admitting: Urology

## 2024-10-13 DIAGNOSIS — C61 Malignant neoplasm of prostate: Secondary | ICD-10-CM

## 2024-10-26 DIAGNOSIS — Z8546 Personal history of malignant neoplasm of prostate: Secondary | ICD-10-CM | POA: Diagnosis not present

## 2024-10-26 DIAGNOSIS — R7303 Prediabetes: Secondary | ICD-10-CM | POA: Diagnosis not present

## 2024-10-26 DIAGNOSIS — I480 Paroxysmal atrial fibrillation: Secondary | ICD-10-CM | POA: Diagnosis not present

## 2024-10-26 DIAGNOSIS — Z6831 Body mass index (BMI) 31.0-31.9, adult: Secondary | ICD-10-CM | POA: Diagnosis not present

## 2024-10-26 DIAGNOSIS — E66811 Obesity, class 1: Secondary | ICD-10-CM | POA: Diagnosis not present

## 2024-10-26 DIAGNOSIS — I10 Essential (primary) hypertension: Secondary | ICD-10-CM | POA: Diagnosis not present

## 2024-10-26 DIAGNOSIS — D075 Carcinoma in situ of prostate: Secondary | ICD-10-CM | POA: Diagnosis not present

## 2024-10-26 DIAGNOSIS — N182 Chronic kidney disease, stage 2 (mild): Secondary | ICD-10-CM | POA: Diagnosis not present

## 2024-10-26 DIAGNOSIS — E7849 Other hyperlipidemia: Secondary | ICD-10-CM | POA: Diagnosis not present

## 2024-11-01 ENCOUNTER — Encounter: Payer: Self-pay | Admitting: Radiology

## 2024-11-04 ENCOUNTER — Ambulatory Visit: Admitting: Cardiology

## 2024-11-09 ENCOUNTER — Inpatient Hospital Stay: Attending: Physician Assistant

## 2024-11-09 ENCOUNTER — Other Ambulatory Visit: Payer: Self-pay

## 2024-11-09 DIAGNOSIS — D708 Other neutropenia: Secondary | ICD-10-CM

## 2024-11-09 DIAGNOSIS — C61 Malignant neoplasm of prostate: Secondary | ICD-10-CM | POA: Insufficient documentation

## 2024-11-09 DIAGNOSIS — I10 Essential (primary) hypertension: Secondary | ICD-10-CM | POA: Diagnosis not present

## 2024-11-09 DIAGNOSIS — Z79899 Other long term (current) drug therapy: Secondary | ICD-10-CM | POA: Diagnosis not present

## 2024-11-09 DIAGNOSIS — D72819 Decreased white blood cell count, unspecified: Secondary | ICD-10-CM | POA: Diagnosis not present

## 2024-11-09 DIAGNOSIS — B1910 Unspecified viral hepatitis B without hepatic coma: Secondary | ICD-10-CM | POA: Diagnosis not present

## 2024-11-09 LAB — CBC WITH DIFFERENTIAL/PLATELET
Abs Immature Granulocytes: 0.02 K/uL (ref 0.00–0.07)
Basophils Absolute: 0 K/uL (ref 0.0–0.1)
Basophils Relative: 0 %
Eosinophils Absolute: 0 K/uL (ref 0.0–0.5)
Eosinophils Relative: 0 %
HCT: 45.2 % (ref 39.0–52.0)
Hemoglobin: 14.5 g/dL (ref 13.0–17.0)
Immature Granulocytes: 0 %
Lymphocytes Relative: 10 %
Lymphs Abs: 0.7 K/uL (ref 0.7–4.0)
MCH: 27.7 pg (ref 26.0–34.0)
MCHC: 32.1 g/dL (ref 30.0–36.0)
MCV: 86.4 fL (ref 80.0–100.0)
Monocytes Absolute: 0.5 K/uL (ref 0.1–1.0)
Monocytes Relative: 7 %
Neutro Abs: 6.4 K/uL (ref 1.7–7.7)
Neutrophils Relative %: 83 %
Platelets: 329 K/uL (ref 150–400)
RBC: 5.23 MIL/uL (ref 4.22–5.81)
RDW: 13.2 % (ref 11.5–15.5)
WBC: 7.7 K/uL (ref 4.0–10.5)
nRBC: 0 % (ref 0.0–0.2)

## 2024-11-09 LAB — COMPREHENSIVE METABOLIC PANEL WITH GFR
ALT: 14 U/L (ref 0–44)
AST: 25 U/L (ref 15–41)
Albumin: 4.2 g/dL (ref 3.5–5.0)
Alkaline Phosphatase: 84 U/L (ref 38–126)
Anion gap: 13 (ref 5–15)
BUN: 16 mg/dL (ref 8–23)
CO2: 23 mmol/L (ref 22–32)
Calcium: 9.6 mg/dL (ref 8.9–10.3)
Chloride: 101 mmol/L (ref 98–111)
Creatinine, Ser: 0.8 mg/dL (ref 0.61–1.24)
GFR, Estimated: >60 mL/min (ref >60)
Glucose, Bld: 126 mg/dL — ABNORMAL HIGH (ref 70–99)
Potassium: 4.6 mmol/L (ref 3.5–5.1)
Sodium: 136 mmol/L (ref 135–145)
Total Bilirubin: 0.3 mg/dL (ref 0.0–1.2)
Total Protein: 8.2 g/dL — ABNORMAL HIGH (ref 6.5–8.1)

## 2024-11-09 LAB — TECHNOLOGIST SMEAR REVIEW: WBC MORPHOLOGY: REACTIVE

## 2024-11-15 NOTE — Progress Notes (Unsigned)
 VIRTUAL VISIT via TELEPHONE NOTE [facility]   I connected with Jeffrey [name]  on 11/16/2024 at 8:30 AM by telephone and verified that I am speaking with the correct person using two identifiers.  Location: Patient: Home Provider: Home office   I discussed the limitations, risks, security and privacy concerns of performing an evaluation and management service by telephone and the availability of in person appointments. I also discussed with the patient that there may be a patient responsible charge related to this service. The patient expressed understanding and agreed to proceed.  REASON FOR VISIT: Leukopenia  CURRENT THERAPY: Surveillance  INTERVAL HISTORY:  Jeffrey [name] is contacted today for follow-up of leukopenia.   He was last seen by Johnston Police, PA-C on 08/16/2024.  At today's visit, [name] reports he is doing well without any fatigue or changes in his ADLs. He has a 100% energy, 100% appetite, and denies any noticeable weight loss. He denies nausea, vomiting or bowel habit changes. He denies easy bruising or signs of active bleeding. He denies any signs of infection including fevers, chills, sinusitis, cough or urinary symptoms. He has no other complaints.  Rest of the 10 point ROS as below.   ASSESSMENT & PLAN:  1.  Leukopenia - Referred after labs from PCP (Dr. Darroll) on 07/07/2024 showed WBC 1.8 with ANC 1.0 and ALC 0.5 - Patient was also prescribed 10-day course of doxycycline from 07/07/2024 through 07/17/2024, due to reported tick bites with fatigue, achiness, and general malaise  - Review of past labs with limited results available in EMR shows WBC 4.4 in 2017 and again in 2018. - Labs from 08/16/2024 showed WBC improving at 3.5, with additional workup below. Technologist smear review showed large granular lymphocytes (reactive lymphocytes) with ovalocytes. Normal ESR and CRP. Normal B12, MMA, folate Hepatitis B, HIV negative. - Most  recent CBC/D (11/09/2024) shows normal WBC 7.7 with normal differential.  Smear review shows reactive lymphocytes, ovalocytes, and large platelets present - nonspecific, could indicate infection or inflammation.  CMP unremarkable. - He is currently asymptomatic - DIFFERENTIAL DIAGNOSIS favors isolated leukopenia reactive from tickborne illness, as WBC has normalized after treatment with doxycycline.   He could also have some underlying benign ethnic neutropenia with baseline WBCs on the lower end of normal. - PLAN: Check CBC/D only in 3 months. - Repeat CBC/D and technologist smear review in 6 months followed by office visit. - If overall stable at that time, would consider discharge to PCP. - Patient advised to reach out if there is any worsening WBC in the interim.  2.  Prostate cancer - Under observation by urology, last seen by Dr. Matilda on 09/21/2024 - PSA at 9.2, trending gradually upwards as of 09/13/2024 - MRI prostate (10/05/2024) with PI-RADS category 3 lesion. - PLAN: Per result note on MRI by Dr. Matilda, planning on upcoming repeat fusion biopsy of prostate lesion.  3.  Other history - Additional PMH includes hypertension and prediabetes - Patient is Jehovah's Witness  PLAN SUMMARY: >> CBC/D only in 3 months >> Labs in 6 months = CBC/D, technologist smear review >> OFFICE visit in 6 months (1 week after labs)     REVIEW OF SYSTEMS:   Review of Systems  Constitutional:  Negative for chills, diaphoresis, fever, malaise/fatigue and weight loss.  Respiratory:  Negative for cough and shortness of breath.   Cardiovascular:  Negative for chest pain and palpitations.  Gastrointestinal:  Negative for abdominal pain, blood  in stool, melena, nausea and vomiting.  Neurological:  Negative for dizziness and headaches.     PHYSICAL EXAM: (per limitations of virtual telephone visit)  The patient is alert and oriented x 3, exhibiting adequate mentation, good mood, and ability to  speak in full sentences and execute sound judgement.  WRAP UP:   I discussed the assessment and treatment plan with the patient. The patient was provided an opportunity to ask questions and all were answered. The patient agreed with the plan and demonstrated an understanding of the instructions.   The patient was advised to call back or seek an in-person evaluation if the symptoms worsen or if the condition fails to improve as anticipated.  I provided 15 minutes of non-face-to-face time during this encounter, including >10 minutes of medical discussion  Pleasant [name] [name] 11/16/24 8:49 AM

## 2024-11-16 ENCOUNTER — Inpatient Hospital Stay (HOSPITAL_BASED_OUTPATIENT_CLINIC_OR_DEPARTMENT_OTHER): Admitting: Physician Assistant

## 2024-11-16 DIAGNOSIS — D708 Other neutropenia: Secondary | ICD-10-CM

## 2024-11-17 ENCOUNTER — Ambulatory Visit: Admitting: Orthopedic Surgery

## 2024-11-17 ENCOUNTER — Encounter: Payer: Self-pay | Admitting: Orthopedic Surgery

## 2024-11-17 VITALS — BP 150/108 | HR 63 | Ht 71.0 in | Wt 200.0 lb

## 2024-11-17 DIAGNOSIS — M65961 Unspecified synovitis and tenosynovitis, right lower leg: Secondary | ICD-10-CM

## 2024-11-17 NOTE — Progress Notes (Signed)
 Office Visit Note   Patient: Jeffrey [name]           Date of Birth: [birth year]           MRN: [id number] Visit Date: 11/17/2024 Requested by: Orpha Yancey LABOR, MD [address] [place],  [place] [address] PCP: Orpha Yancey LABOR, MD   Assessment & Plan:   Encounter Diagnosis  Name Primary?   Synovitis of right knee Yes   73 year old male previously seen by Dr. Barbarann for synovitis and arthritis of the right knee previously treated with injection  Improved with oral steroids no need to intervene at this time     Subjective: Chief Complaint  Patient presents with   Knee Pain    Right for 3-4 days states took prednisone taper and it helped     HPI: 73 year old male called his primary care doctor several days ago complaining of acute pain and swelling of his right knee and inability to bend it.  He had previously seen by Dr. Barbarann for synovitis and arthritis treated with injection with good result  The patient had been doing some yard work and was on his feet all day and noticed the pain and swelling and inability to bend the knee  After the oral steroids he has become asymptomatic              ROS: None   Images personally read and my interpretation : None  Visit Diagnoses:  1. Synovitis of right knee      Follow-Up Instructions: Return if symptoms worsen or fail to improve.    Objective: Vital Signs: BP (!) 150/108   Pulse 63   Ht 5' 11 (1.803 m)   Wt 200 lb (90.7 kg)   BMI 27.89 kg/m   Physical Exam Vitals and nursing note reviewed.  Constitutional:      Appearance: Normal appearance.  HENT:     Head: Normocephalic and atraumatic.  Eyes:     General: No scleral icterus.       Right eye: No discharge.        Left eye: No discharge.     Extraocular Movements: Extraocular movements intact.     Conjunctiva/sclera: Conjunctivae normal.     Pupils: Pupils are equal, round, and reactive to light.  Cardiovascular:     Rate and Rhythm: Normal rate.      Pulses: Normal pulses.  Musculoskeletal:     Right knee: Effusion present.  Skin:    General: Skin is warm and dry.     Capillary Refill: Capillary refill takes less than 2 seconds.  Neurological:     General: No focal deficit present.     Mental Status: He is alert and oriented to person, place, and time.     Gait: Gait normal.  Psychiatric:        Mood and Affect: Mood normal.        Behavior: Behavior normal.        Thought Content: Thought content normal.        Judgment: Judgment normal.      Right Knee Exam  Right knee exam is normal.  Muscle Strength  The patient has normal right knee strength.  Range of Motion  The patient has normal right knee ROM.  Other  Effusion: effusion present       Specialty Comments:  No specialty comments available.  Imaging: No results found.   PMFS History: Patient Active Problem List   Diagnosis Date  Noted   Unilateral primary osteoarthritis, right knee 11/20/2023   Synovitis of right knee 11/06/2023   Malignant neoplasm of prostate (HCC) 04/02/2021   Palpitations 02/25/2012   Past Medical History:  Diagnosis Date   Anxiety state, unspecified    Encounter for long-term (current) use of other medications    Gout    Hyperchloremia arthrit   Other and unspecified hyperlipidemia    Other malaise and fatigue    Palpitations    Precordial pain    Prediabetes    Prostate cancer (HCC)    Pure hypercholesterolemia    Shortness of breath    Solitary pulmonary nodule    Syncope and collapse    Unspecified essential hypertension     Family History  Problem Relation Age of Onset   Heart disease Mother    Cancer Maternal Uncle    Breast cancer Neg Hx    Colon cancer Neg Hx    Pancreatic cancer Neg Hx    Prostate cancer Neg Hx     Past Surgical History:  Procedure Laterality Date   PROSTATE BIOPSY     WISDOM TOOTH EXTRACTION     Social History   Occupational History   Occupation: retired  Tobacco Use    Smoking status: Never   Smokeless tobacco: Never  Vaping Use   Vaping status: Never Used  Substance and Sexual Activity   Alcohol use: Yes    Alcohol/week: 0.0 standard drinks of alcohol    Comment: very rare   Drug use: No   Sexual activity: Yes

## 2024-11-17 NOTE — Progress Notes (Signed)
  Intake history:  Chief Complaint  Patient presents with   Knee Pain    Right for 3-4 days states took prednisone taper and it helped      BP (!) 150/108   Pulse 63   Ht 5' 11 (1.803 m)   Wt 200 lb (90.7 kg)   BMI 27.89 kg/m  Body mass index is 27.89 kg/m.  Pharmacy? ____[facility]__________________________________  WHAT ARE WE SEEING YOU FOR TODAY?   Right knee  How long has this bothered you? (DOI?DOS?WS?)  Was painful for a few days better now  Was there an injury? No  Anticoag.  No   Any ALLERGIES ___________________No Known Allergies ___________________________   Treatment:  Have you taken:  Tylenol Yes  Advil No  Had PT No  Had injection No  Other  __________had steroid taper ____________

## 2024-11-17 NOTE — Patient Instructions (Addendum)
 Most likely you had a recurrent case of synovitis brought on by increased activity.  This is most likely due to underlying arthritis of the knee.  Although the oral medication worked I expect that you may have another episode depending on your activity level  Feel free to let us  know and we can treat that with oral medication or injection

## 2024-12-01 DIAGNOSIS — H401222 Low-tension glaucoma, left eye, moderate stage: Secondary | ICD-10-CM | POA: Diagnosis not present

## 2024-12-08 ENCOUNTER — Telehealth: Payer: Self-pay | Admitting: Urology

## 2024-12-08 ENCOUNTER — Other Ambulatory Visit: Payer: Self-pay | Admitting: Urology

## 2024-12-08 DIAGNOSIS — C61 Malignant neoplasm of prostate: Secondary | ICD-10-CM

## 2024-12-08 NOTE — Telephone Encounter (Signed)
 Return call to patient from triage after hours. Pt state's he was inquiring about his fusion biopsy. Pt state's he and Tammy from [facility] has not be able to communicated due to miss calls from both sides. Pt is aware a message will be sent to Dr. Matilda to make him aware of the situation. Voiced understanding

## 2024-12-08 NOTE — Telephone Encounter (Signed)
 Pt was made aware and voiced his understanding.  Let him know that our [facility] office now does fusion biopsies. I have put a referral in for that rather than [facility].

## 2024-12-27 ENCOUNTER — Encounter: Payer: Self-pay | Admitting: *Deleted

## 2025-01-05 ENCOUNTER — Telehealth: Payer: Self-pay

## 2025-01-05 NOTE — Telephone Encounter (Signed)
-----   Message from Garnette Shack, MD sent at 01/05/2025 12:27 PM EST ----- I just called Mr. Spickler with his biopsy result.  Please arrange for his biopsy specimen that was taken at [facility] to be sent for decipher testing

## 2025-01-05 NOTE — Progress Notes (Signed)
 I called patient with result.  We will send his biopsy specimen for decipher.

## 2025-01-05 NOTE — Telephone Encounter (Signed)
-----   Message from Garnette Shack, MD sent at 01/05/2025 12:27 PM EST ----- I just called Mr. Jeffrey [name] with his biopsy result.  Please arrange for his biopsy specimen that was taken at [facility] urology to be sent for decipher testing

## 2025-01-07 NOTE — Telephone Encounter (Signed)
 Questions should be listed below let me know if I need to resend

## 2025-01-10 ENCOUNTER — Telehealth: Payer: Self-pay

## 2025-01-10 NOTE — Telephone Encounter (Signed)
-----   Message from Garnette Shack, MD sent at 01/10/2025 12:08 PM EST ----- PSA 8.4 Bx date 12.29.25 Stage T1c No prior treatment Grade Group 2 (Gleason 3+4) 3/13 cores positive ----- Message ----- From: Sammie Exie HERO, CMA Sent: 01/05/2025   2:26 PM EST To: Garnette Shack, MD  Please answer the following the questions to proceed with Decipher  Most recent PSA: Date of Biopsy: Radiation or Hormone Therapy Prior to Biopsy: Tumor Stage:  Gleason Score: Number of Positive Scores:

## 2025-01-10 NOTE — Telephone Encounter (Signed)
 Decipher testing completed and scanned into charts

## 2025-01-12 ENCOUNTER — Telehealth: Payer: Self-pay

## 2025-01-12 NOTE — Telephone Encounter (Signed)
 Decipher test sent in per MD Dahlstedt request

## 2025-01-12 NOTE — Telephone Encounter (Signed)
-----   Message from Nurse [facility] C sent at 01/12/2025  4:27 PM EST ----- Jeffrey [name] pt

## 2025-01-26 ENCOUNTER — Other Ambulatory Visit: Payer: Self-pay | Admitting: Urology

## 2025-01-26 DIAGNOSIS — C61 Malignant neoplasm of prostate: Secondary | ICD-10-CM

## 2025-01-26 NOTE — Progress Notes (Signed)
 I called with results.  Less favorable prostate cancer on genomic studies.  We will set up an appointment for him to see radiation oncology.

## 2025-01-28 ENCOUNTER — Telehealth: Payer: Self-pay | Admitting: Radiation Oncology

## 2025-01-28 NOTE — Telephone Encounter (Signed)
 1/30 @ 10:26 am Called both patient's contact numbers, left voicemail for patient to call our office to be sch for consult.

## 2025-02-03 NOTE — Progress Notes (Incomplete)
 GU Location of Tumor / Histology: Prostate Ca  If Prostate Cancer, Gleason Score is (3 + 4) and PSA is (9.2 on  09/13/2024)  PSA 8.4 on 03/08/2024 PSA 7.9 on 09/09/2023 PSA 6.5 on 03/11/2023 PSA 5.3 on 12/04/2021  Jeffrey [name] presented as referral from Dr. Garnette Shack ([facility]) elevated PSA.  Biopsies      10/05/2024 Dr. Garnette Shack MR Prostate with/without Contrast CLINICAL DATA:  Intermediate risk prostate cancer under active surveillance. Biopsy 01/09/2021 showed Gleason 3+4=7 prostate adenocarcinoma at the right apex. IMPRESSION: 1. Essentially stable PI-RADS category 3 lesion of the bilateral anterior fibromuscular stroma and left anterior transition zone at the apex. Targeting data sent to [facility]. 2. Mild prostatomegaly and benign prostatic hypertrophy. 3. Mild sigmoid colon diverticulosis.   Past/Anticipated interventions by urology, if any: N/A  Past/Anticipated interventions by medical oncology, if any:  N/A  Weight changes, if any: {:18581}  IPSS: SHIM:  Bowel/Bladder complaints, if any: {:18581}   Nausea/Vomiting, if any: {:18581}  Pain issues, if any:  {:18581}  SAFETY ISSUES: Prior radiation? {:18581} Pacemaker/ICD? {:18581} Possible current pregnancy? Male Is the patient on methotrexate? No  Current Complaints / other details:    30 minutes  spent total, including time for meaningful use questions, reviewing medication, as well as spent in face-to-face time in nurse evaluation with the patient.

## 2025-02-14 ENCOUNTER — Ambulatory Visit: Admitting: Radiation Oncology

## 2025-02-14 ENCOUNTER — Ambulatory Visit

## 2025-02-16 ENCOUNTER — Inpatient Hospital Stay: Attending: Physician Assistant

## 2025-05-16 ENCOUNTER — Inpatient Hospital Stay: Attending: Physician Assistant

## 2025-05-24 ENCOUNTER — Inpatient Hospital Stay: Admitting: Physician Assistant
